# Patient Record
Sex: Female | Born: 1980 | Race: White | Hispanic: No | Marital: Married | Smoking: Never smoker
Health system: Southern US, Community
[De-identification: ages and names within clinical notes are randomized; demographics above are authoritative.]

## PROBLEM LIST (undated history)

## (undated) DIAGNOSIS — F419 Anxiety disorder, unspecified: Secondary | ICD-10-CM

## (undated) DIAGNOSIS — F32A Depression, unspecified: Secondary | ICD-10-CM

## (undated) DIAGNOSIS — E282 Polycystic ovarian syndrome: Secondary | ICD-10-CM

## (undated) DIAGNOSIS — F329 Major depressive disorder, single episode, unspecified: Secondary | ICD-10-CM

## (undated) HISTORY — DX: Depression, unspecified: F32.A

## (undated) HISTORY — DX: Anxiety disorder, unspecified: F41.9

## (undated) HISTORY — DX: Major depressive disorder, single episode, unspecified: F32.9

## (undated) HISTORY — DX: Polycystic ovarian syndrome: E28.2

---

## 2009-11-08 ENCOUNTER — Ambulatory Visit: Payer: Self-pay | Admitting: Obstetrics

## 2010-02-12 ENCOUNTER — Ambulatory Visit: Payer: Self-pay | Admitting: Obstetrics

## 2010-03-09 ENCOUNTER — Ambulatory Visit: Payer: Self-pay | Admitting: Obstetrics

## 2010-03-19 ENCOUNTER — Encounter: Payer: Self-pay | Admitting: Maternal & Fetal Medicine

## 2010-03-30 ENCOUNTER — Inpatient Hospital Stay (HOSPITAL_COMMUNITY)
Admission: AD | Admit: 2010-03-30 | Discharge: 2010-04-01 | Payer: Self-pay | Source: Home / Self Care | Attending: Obstetrics | Admitting: Obstetrics

## 2010-03-30 ENCOUNTER — Ambulatory Visit: Payer: Self-pay | Admitting: Obstetrics

## 2010-03-30 ENCOUNTER — Encounter: Payer: Self-pay | Admitting: Obstetrics & Gynecology

## 2010-05-02 NOTE — Discharge Summary (Addendum)
  Katie Benjamin, TRAXLER             ACCOUNT NO.:  [id number]  MEDICAL RECORD NO.:  [id number]          PATIENT TYPE:  INP  LOCATION:  9110                          FACILITY:  [facility]  PHYSICIAN:  Roseanna Rainbow, M.D.DATE OF BIRTH:  03-19-1981  DATE OF ADMISSION:  03/30/2010 DATE OF DISCHARGE:  04/01/2010                              DISCHARGE SUMMARY   CHIEF COMPLAINT:  The patient is a 30 year old, para 0 with an estimated date of confinement of April 08, 2010, with an intrauterine pregnancy at 38 plus weeks with oligohydramnios and a breech presentation.  HISTORY OF PRESENT ILLNESS:  Please see the above.  The patient was followed during the latter part of the pregnancy for oligohydramnios with serial ultrasounds and biweekly fetal testing.  An ultrasound on the day of admission showed an amniotic fluid index of 5 in a breech presentation.  The patient denies any symptoms consistent with spontaneous rupture of membranes.  ALLERGIES:  No known drug allergies.  MEDICATIONS:  Please see the medication reconciliation form.  PRENATAL LABORATORY DATA:  Chlamydia probe negative.  Urine culture and sensitivity no growth.  Pap smear negative, GC probe negative, GBS negative, 2-hour GTT within normal limits.  Hepatitis B surface antigen negative, platelets 250,000, blood type A positive, antibody screen negative, quad screen negative.  PAST GYNECOLOGIC HISTORY:  She denies.  PAST MEDICAL HISTORY:  She denies.  PAST SURGICAL HISTORY:  Tonsils and adenoids.  SOCIAL HISTORY:  She is a Airline pilot person.  She is married.  She denies any tobacco, ethanol or drug use.  Stressors include financial difficulty.  FAMILY HISTORY:  Hypertension, adult-onset diabetes.  REVIEW OF SYSTEMS:  Noncontributory.  PHYSICAL EXAMINATION:  VITAL SIGNS:  Stable, afebrile.  Blood pressure is 140/90. GENERAL:  Fetal heart tracing in the 150s with good long-term variability, no decelerations. ABDOMEN:   Gravid.  ASSESSMENT AND PLAN:  [place]ip[place] at term, oligohydramnios, amniotic fluid index 5, breech presentation, elevated blood pressures.  PLAN:  Admission, check a PIH panel, urinalysis. A primary cesarean delivery is planned.  Informed consent had been obtained.  HOSPITAL COURSE:  The patient was admitted.  She underwent a cesarean delivery.  Please see the dictated operative summary for findings.  On postoperative day 1, a hemoglobin was 10.1.  A preoperative hemoglobin was 12.8.  The patient was hemodynamically stable.  The patient's blood pressures remained stable and she was discharged to home on postoperative day 2.  DISCHARGE DIAGNOSES:  Intrauterine pregnancy at term, oligohydramnios, breech presentation.  PROCEDURES:  Cesarean delivery.  CONDITION:  Stable.  DIET:  Regular.  ACTIVITY:  Pelvic rest progressive activity.  MEDICATIONS:  Prenatal vitamins, Percocet 5/325, one to two tabs every 6 h. as needed.  DISPOSITION:  The patient was to follow up in the office in several days for staple removal.     Roseanna Rainbow, M.D.     LAJ/MEDQ  D:  04/28/2010  T:  04/29/2010  Job:  [id number]  Electronically Signed by Antionette Char M.D. on 05/02/2010 09:01:49 PM

## 2010-06-18 LAB — CBC
MCH: 31.4 pg (ref 26.0–34.0)
MCHC: 33.1 g/dL (ref 30.0–36.0)
MCHC: 34.5 g/dL (ref 30.0–36.0)
MCV: 90.9 fL (ref 78.0–100.0)
Platelets: 264 10*3/uL (ref 150–400)
RBC: 4.08 MIL/uL (ref 3.87–5.11)
RDW: 13.7 % (ref 11.5–15.5)
WBC: 13.7 10*3/uL — ABNORMAL HIGH (ref 4.0–10.5)

## 2010-06-18 LAB — COMPREHENSIVE METABOLIC PANEL
Alkaline Phosphatase: 138 U/L — ABNORMAL HIGH (ref 39–117)
BUN: 8 mg/dL (ref 6–23)
Creatinine, Ser: 0.52 mg/dL (ref 0.4–1.2)
Glucose, Bld: 78 mg/dL (ref 70–99)
Potassium: 4.1 mEq/L (ref 3.5–5.1)
Total Bilirubin: 0.6 mg/dL (ref 0.3–1.2)
Total Protein: 6.3 g/dL (ref 6.0–8.3)

## 2010-06-18 LAB — URIC ACID: Uric Acid, Serum: 3.6 mg/dL (ref 2.4–7.0)

## 2010-06-18 LAB — LACTATE DEHYDROGENASE: LDH: 213 U/L (ref 94–250)

## 2010-06-18 LAB — RPR: RPR Ser Ql: NONREACTIVE

## 2011-04-24 IMAGING — US US OB FOLLOW-UP
1 series · 17 of 28 positions shown · non-contrast
Comparison: none

REASON FOR EXAM: decreased amniotic fluid
COMMENTS:

[Series 1: us ob follow-up · 17 of 36 slices shown]
[im 1/36]
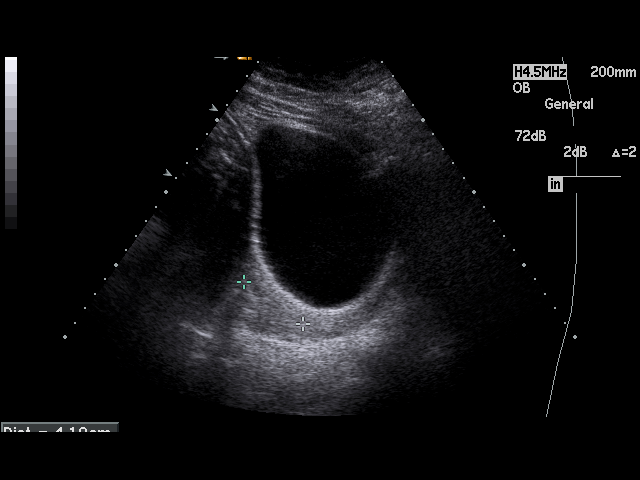
[im 3/36]
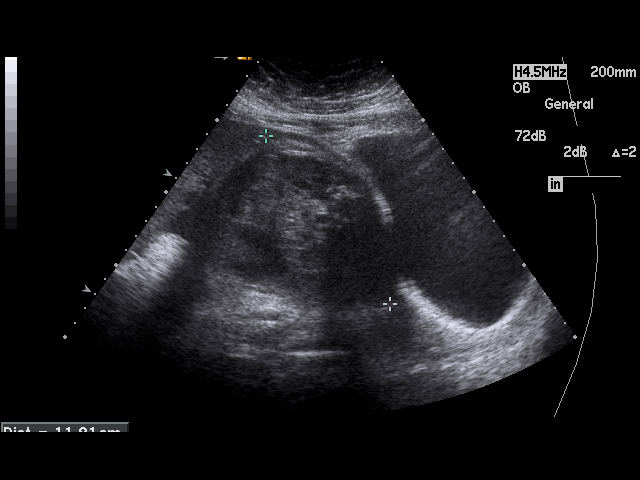
[im 6/36]
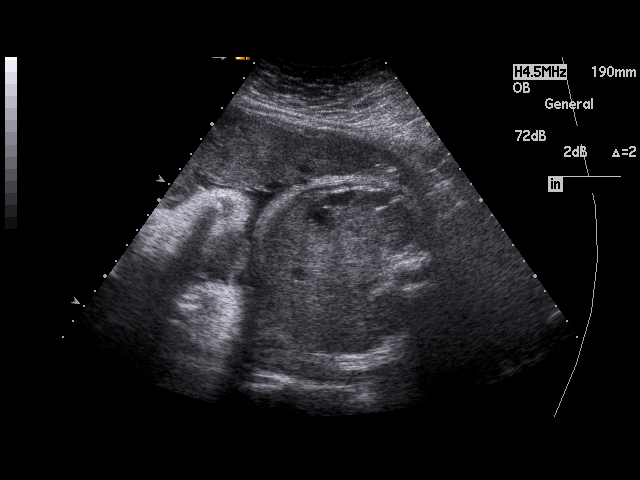
[im 7/36]
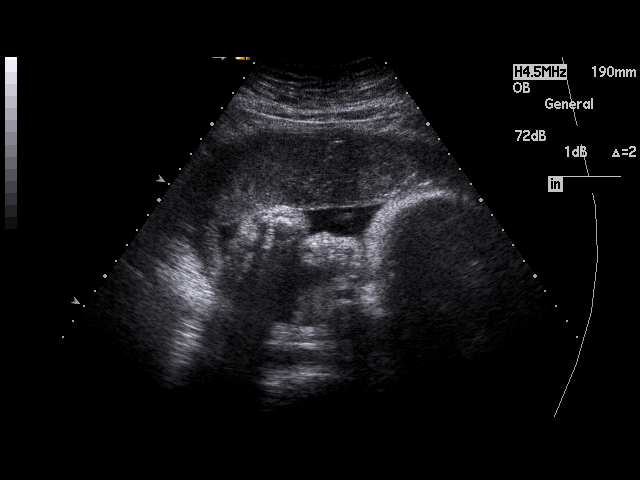
[im 10/36]
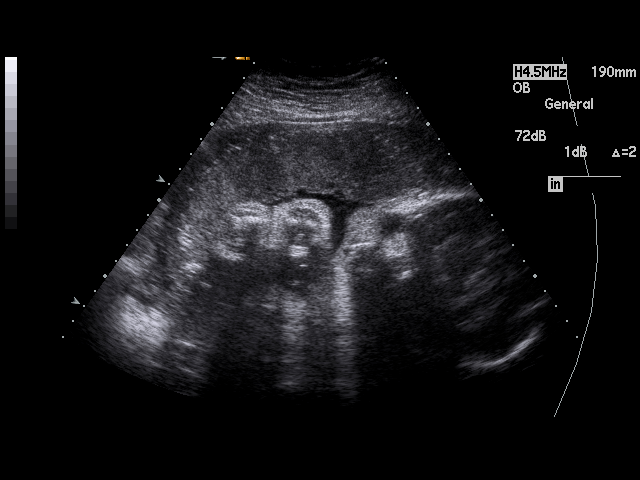
[im 12/36]
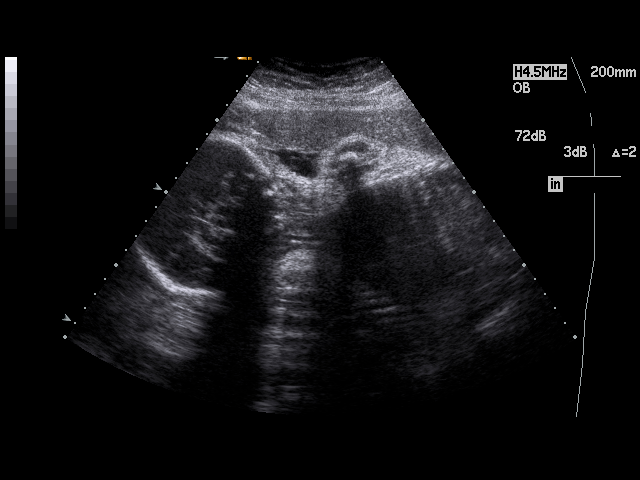
[im 13/36]
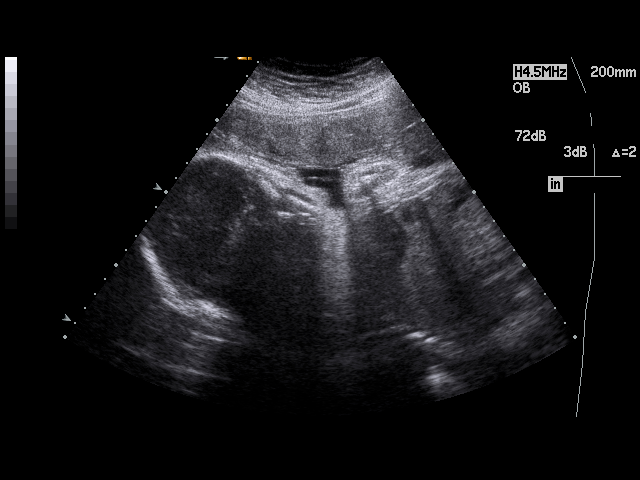
[im 16/36]
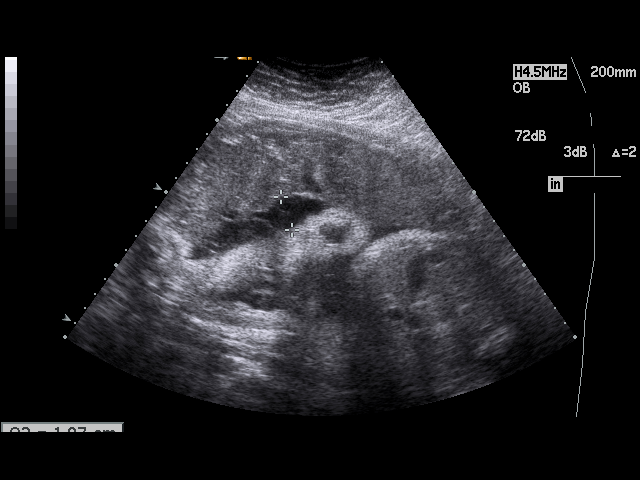
[im 19/36]
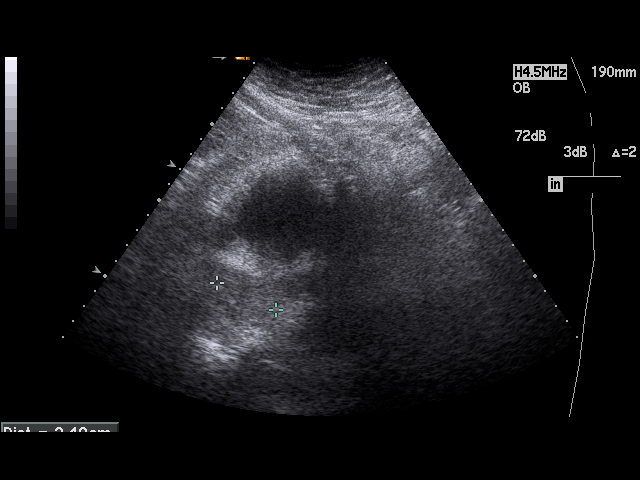
[im 20/36]
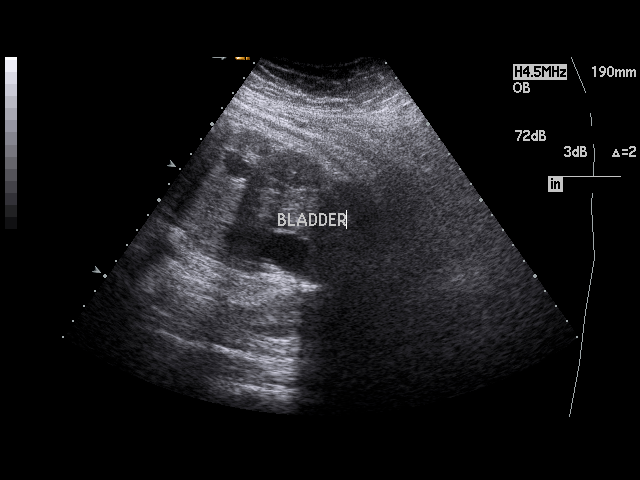
[im 23/36]
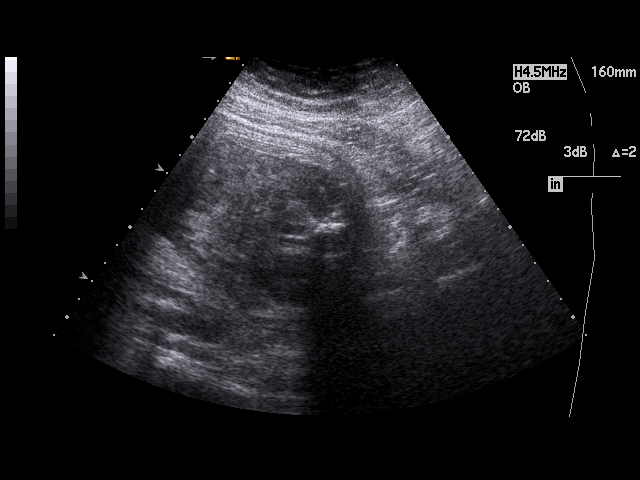
[im 24/36]
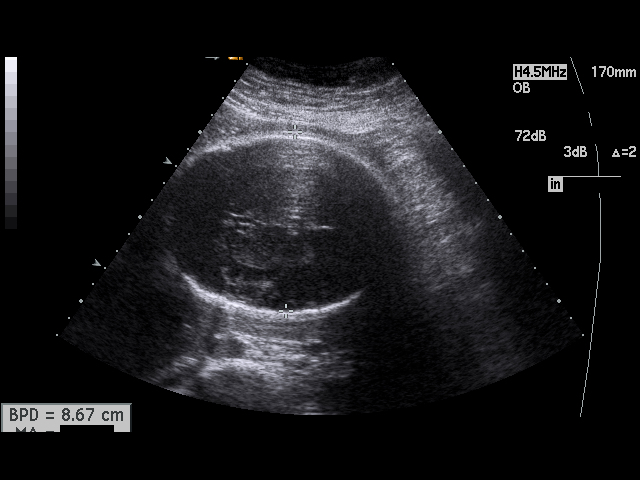
[im 26/36]
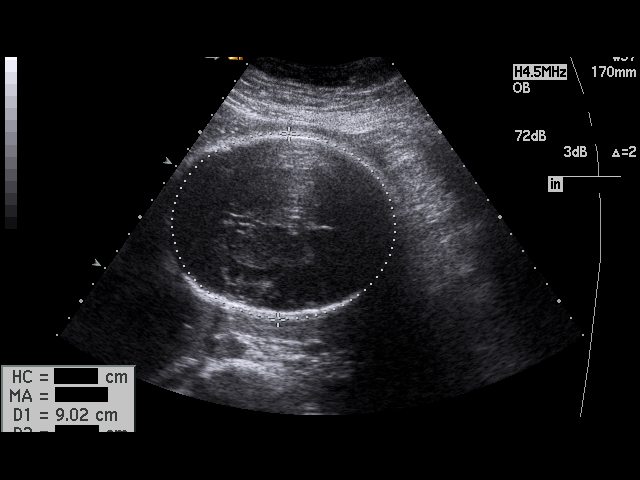
[im 29/36]
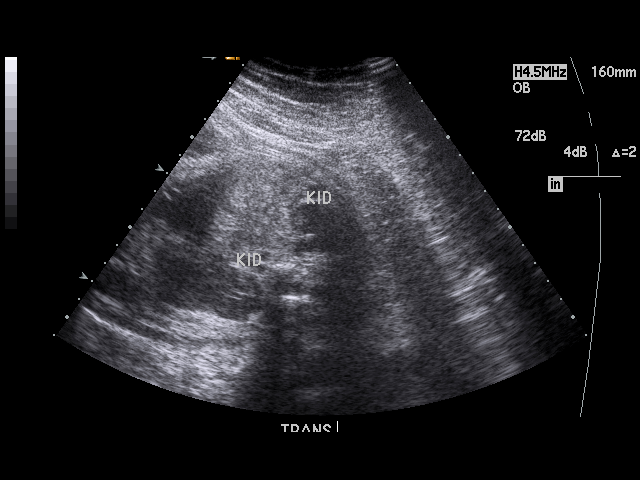
[im 30/36]
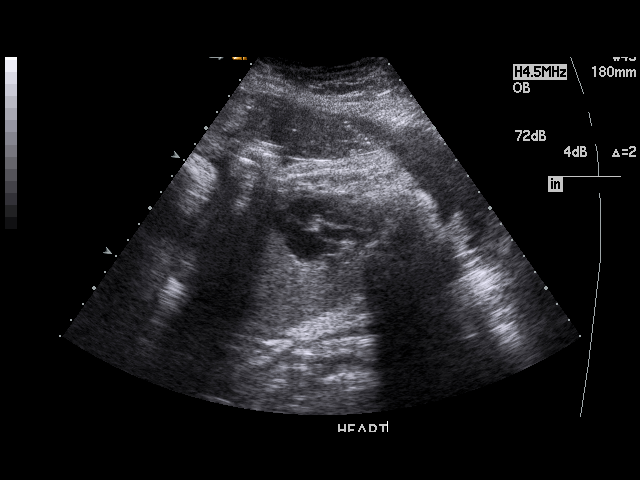
[im 33/36]
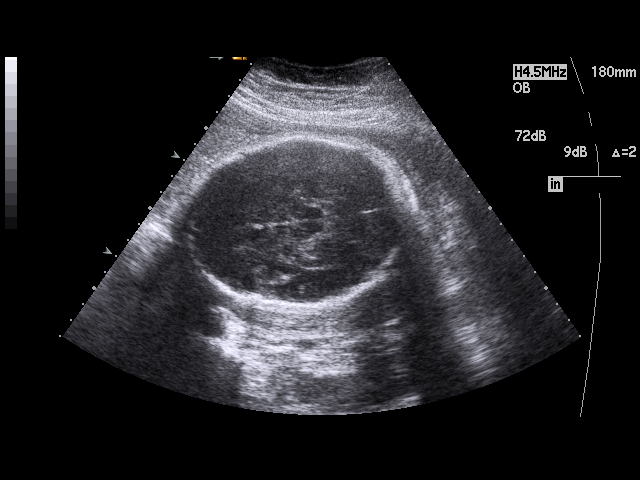
[im 36/36]
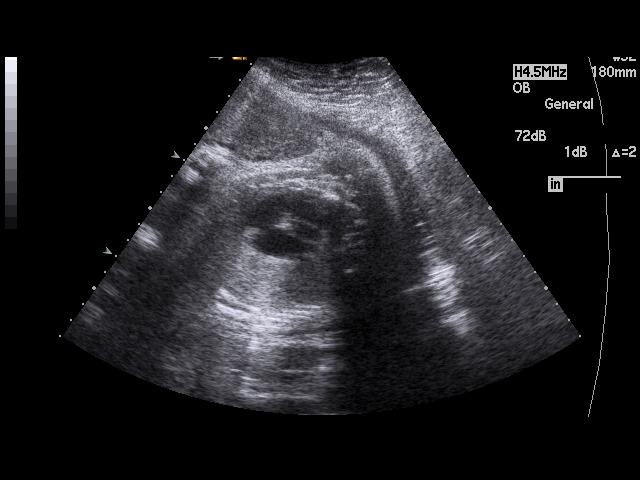

[17 of 28 positions shown; findings below may reference images not displayed]

PROCEDURE:     US  - US OB FOLLOW UP  - March 09, 2010 [DATE]

RESULT:     The patient underwent previous fetal ultrasounds in [REDACTED] and
early February 2010. On today's study the amniotic fluid index is decreased
at approximately 10.8%. This is not greatly changed from the previous study.
A fetal cardiac rate of 124 beats per minute was demonstrated. Survey views
of the the stomach and kidneys and urinary bladder were normal. The placenta
exhibits no evidence of placenta previa.

Measured parameters:
BPD 8.67 cm corresponding to an EGA of 35 weeks 0 days
HC 31.24 cm corresponding to an EGA of 35 weeks 0 days
A.c. 28.8 cm corresponding to an EGA of 32 weeks 6 days
And FL 6. 97 cm corresponding to an EGA of 35 weeks 5 days. The estimated
fetal weight is 6770 grams plus or minus 350 grams.
IMPRESSION: There is a viable IUP with estimated gestational age of 34
weeks 4 days plus or minus approximately 21 days. The estimated date of
confinement is 16 April, 2010. The amniotic fluid volume is between the 5th
and 50th percentile.

## 2011-06-30 ENCOUNTER — Encounter (HOSPITAL_COMMUNITY): Payer: Self-pay

## 2011-06-30 ENCOUNTER — Emergency Department (HOSPITAL_COMMUNITY)

## 2011-06-30 ENCOUNTER — Encounter (HOSPITAL_COMMUNITY): Payer: Self-pay | Admitting: *Deleted

## 2011-06-30 ENCOUNTER — Emergency Department (HOSPITAL_COMMUNITY)
Admission: EM | Admit: 2011-06-30 | Discharge: 2011-06-30 | Disposition: A | Discharge status: Home / Self Care | Attending: Emergency Medicine | Admitting: Emergency Medicine

## 2011-06-30 ENCOUNTER — Emergency Department (INDEPENDENT_AMBULATORY_CARE_PROVIDER_SITE_OTHER)
Admission: EM | Admit: 2011-06-30 | Discharge: 2011-06-30 | Disposition: A | Discharge status: Nursing Home (Includes ICF) | Source: Home / Self Care | Attending: Emergency Medicine | Admitting: Emergency Medicine

## 2011-06-30 DIAGNOSIS — K5732 Diverticulitis of large intestine without perforation or abscess without bleeding: Secondary | ICD-10-CM | POA: Insufficient documentation

## 2011-06-30 DIAGNOSIS — R109 Unspecified abdominal pain: Secondary | ICD-10-CM

## 2011-06-30 DIAGNOSIS — K5792 Diverticulitis of intestine, part unspecified, without perforation or abscess without bleeding: Secondary | ICD-10-CM

## 2011-06-30 DIAGNOSIS — R1032 Left lower quadrant pain: Secondary | ICD-10-CM | POA: Insufficient documentation

## 2011-06-30 LAB — DIFFERENTIAL
Basophils Absolute: 0 10*3/uL (ref 0.0–0.1)
Basophils Relative: 0 % (ref 0–1)
Eosinophils Absolute: 0.1 10*3/uL (ref 0.0–0.7)
Eosinophils Relative: 1 % (ref 0–5)
Lymphocytes Relative: 25 % (ref 12–46)
Lymphs Abs: 2.6 10*3/uL (ref 0.7–4.0)
Monocytes Absolute: 0.6 10*3/uL (ref 0.1–1.0)
Monocytes Relative: 6 % (ref 3–12)
Neutro Abs: 6.8 10*3/uL (ref 1.7–7.7)
Neutrophils Relative %: 67 % (ref 43–77)

## 2011-06-30 LAB — COMPREHENSIVE METABOLIC PANEL
ALT: 31 U/L (ref 0–35)
AST: 20 U/L (ref 0–37)
Albumin: 3.7 g/dL (ref 3.5–5.2)
Alkaline Phosphatase: 91 U/L (ref 39–117)
BUN: 8 mg/dL (ref 6–23)
CO2: 25 mEq/L (ref 19–32)
Calcium: 9.3 mg/dL (ref 8.4–10.5)
Chloride: 103 mEq/L (ref 96–112)
Creatinine, Ser: 0.72 mg/dL (ref 0.50–1.10)
GFR calc Af Amer: >90 mL/min (ref >90)
GFR calc non Af Amer: >90 mL/min (ref >90)
Glucose, Bld: 85 mg/dL (ref 70–99)
Potassium: 3.9 mEq/L (ref 3.5–5.1)
Sodium: 138 mEq/L (ref 135–145)
Total Bilirubin: 0.3 mg/dL (ref 0.3–1.2)
Total Protein: 6.9 g/dL (ref 6.0–8.3)

## 2011-06-30 LAB — WET PREP, GENITAL
Clue Cells Wet Prep HPF POC: NONE SEEN
Trich, Wet Prep: NONE SEEN
Yeast Wet Prep HPF POC: NONE SEEN

## 2011-06-30 LAB — POCT URINALYSIS DIP (DEVICE)
Bilirubin Urine: NEGATIVE
Glucose, UA: NEGATIVE mg/dL
Ketones, ur: NEGATIVE mg/dL
Leukocytes, UA: NEGATIVE
Nitrite: NEGATIVE

## 2011-06-30 LAB — URINALYSIS, ROUTINE W REFLEX MICROSCOPIC
Glucose, UA: NEGATIVE mg/dL
Leukocytes, UA: NEGATIVE
Protein, ur: NEGATIVE mg/dL
Urobilinogen, UA: 0.2 mg/dL (ref 0.0–1.0)

## 2011-06-30 LAB — CBC
HCT: 38.9 % (ref 36.0–46.0)
Hemoglobin: 13.5 g/dL (ref 12.0–15.0)
MCH: 31.4 pg (ref 26.0–34.0)
MCHC: 34.7 g/dL (ref 30.0–36.0)
MCV: 90.5 fL (ref 78.0–100.0)
Platelets: 326 10*3/uL (ref 150–400)
RBC: 4.3 MIL/uL (ref 3.87–5.11)
RDW: 13.4 % (ref 11.5–15.5)
WBC: 10.2 10*3/uL (ref 4.0–10.5)

## 2011-06-30 LAB — POCT PREGNANCY, URINE: Preg Test, Ur: NEGATIVE

## 2011-06-30 MED ORDER — CIPROFLOXACIN HCL 500 MG PO TABS
500.0000 mg | ORAL_TABLET | Freq: Once | ORAL | Status: AC
  Administered 2011-06-30: 500 mg via ORAL
  Filled 2011-06-30: qty 1

## 2011-06-30 MED ORDER — METRONIDAZOLE 500 MG PO TABS: 500.0000 mg | ORAL_TABLET | Freq: Two times a day (BID) | ORAL | Status: AC

## 2011-06-30 MED ORDER — HYDROCODONE-ACETAMINOPHEN 5-500 MG PO TABS: 1.0000 | ORAL_TABLET | Freq: Four times a day (QID) | ORAL | Status: AC | PRN

## 2011-06-30 MED ORDER — IOHEXOL 300 MG/ML  SOLN
100.0000 mL | Freq: Once | INTRAMUSCULAR | Status: AC | PRN
  Administered 2011-06-30: 100 mL via INTRAVENOUS

## 2011-06-30 MED ORDER — CIPROFLOXACIN HCL 500 MG PO TABS: 500.0000 mg | ORAL_TABLET | Freq: Two times a day (BID) | ORAL | Status: AC

## 2011-06-30 MED ORDER — METRONIDAZOLE 500 MG PO TABS
500.0000 mg | ORAL_TABLET | Freq: Once | ORAL | Status: AC
  Administered 2011-06-30: 500 mg via ORAL
  Filled 2011-06-30: qty 1

## 2011-06-30 MED ORDER — IOHEXOL 300 MG/ML  SOLN
20.0000 mL | INTRAMUSCULAR | Status: AC
  Administered 2011-06-30: 20 mL via ORAL

## 2011-06-30 MED ORDER — ONDANSETRON HCL 4 MG PO TABS: 4.0000 mg | ORAL_TABLET | Freq: Four times a day (QID) | ORAL | Status: AC

## 2011-06-30 NOTE — ED Notes (Signed)
Pt sent here from [facility] for further eval of LLQ pain x 12 hours. Pt afebrile, had normal urine at [facility].

## 2011-06-30 NOTE — ED Provider Notes (Signed)
History     CSN: [id number]  Arrival date & time 06/30/11  1207   First MD Initiated Contact with Patient 06/30/11 1309      Chief Complaint  Patient presents with  . Abdominal Pain    (Consider location/radiation/quality/duration/timing/severity/associated sxs/prior treatment) HPI [name] is a 31 yo female with a history of abnormal menstrual periods, who presents today for LLQ tenderness.  Pt states she woke up with pain around midnight but was able to go back to sleep.  When she awoke this morning, there was extreme pain with standing and she was not able to fully extend her back due to the pain in her LLQ.  Once she sat back down the pain subsided, but it is re-aggravated with movement.  Pt did not get relief with the ibuprofen.  She does not want any pain medication now.  She has not been sick recently and denies and fever, chills, N/V/D, weight changes, vaginal discharge or bleeding, rectal bleeding, and has never had anything like this before.    History reviewed. No pertinent past medical history.  Past Surgical History  Procedure Date  . Cesarean section     History reviewed. No pertinent family history.  History  Substance Use Topics  . Smoking status: Never Smoker   . Smokeless tobacco: Not on file  . Alcohol Use: No    OB History    Grav Para Term Preterm Abortions TAB SAB Ect Mult Living                  Review of Systems All pertinent positives and negatives reviewed in the history of present illness  Allergies  Review of patient's allergies indicates no known allergies.  Home Medications   Current Outpatient Rx  Name Route Sig Dispense Refill  . PRENATAL MULTIVITAMIN CH Oral Take 1 tablet by mouth daily.      BP 136/87  Pulse 77  Temp(Src) 97.5 F (36.4 C) (Oral)  Resp 16  Ht 5\' 7"  (1.702 m)  Wt 270 lb (122.471 kg)  BMI 42.29 kg/m2  SpO2 95%  LMP 02/19/2011  Physical Exam  Constitutional: She is oriented to person, place, and time.  She appears well-developed and well-nourished. No distress.  Cardiovascular: Normal rate, regular rhythm, normal heart sounds and intact distal pulses.   No murmur heard. Pulmonary/Chest: Effort normal and breath sounds normal. No respiratory distress. She has no wheezes.  Abdominal: Soft. Bowel sounds are normal. She exhibits no distension. There is tenderness (LLQ tenderness).  Genitourinary: There is no rash, tenderness or lesion on the right labia. There is no rash, tenderness or lesion on the left labia. Cervix exhibits discharge (white smooth discharge from cervical os and in vaginal canal). Cervix exhibits no motion tenderness and no friability. Left adnexum displays tenderness. Left adnexum displays no mass and no fullness. No erythema or bleeding around the vagina. Vaginal discharge found.  Neurological: She is alert and oriented to person, place, and time.  Skin: Skin is warm. No rash noted. She is not diaphoretic. No erythema.    ED Course  Procedures (including critical care time)  Labs Reviewed - No data to display No results found.  Pt seen and assessed.  Does not want any pain meds.  Will order pelvic u/s. 2:36 PM Pt seen.  Pain is still same as before.  Comfortable, resting in bed. 4:05 PM Pelvic done.  Will await results from CT; no cervical motion tenderness; pain in left adnexal region with manual  exam- no acute abnormalities. Dr. Fredderick Phenix given full patient report and will follow up on her. MDM  MDM Reviewed: nursing note and vitals Interpretation: labs, ultrasound and CT scan            Carlyle Dolly, PA-C 06/30/11 1614

## 2011-06-30 NOTE — ED Notes (Signed)
Pt aware of the need for urine sample  

## 2011-06-30 NOTE — ED Notes (Signed)
PA-C at bedside 

## 2011-06-30 NOTE — ED Notes (Signed)
Patient is resting comfortably.no complaints  

## 2011-06-30 NOTE — ED Provider Notes (Signed)
Results for orders placed during the hospital encounter of 06/30/11  CBC      Component Value Range   WBC 10.2  4.0 - 10.5 (K/uL)   RBC 4.30  3.87 - 5.11 (MIL/uL)   Hemoglobin 13.5  12.0 - 15.0 (g/dL)   HCT 40.9  81.1 - 91.4 (%)   MCV 90.5  78.0 - 100.0 (fL)   MCH 31.4  26.0 - 34.0 (pg)   MCHC 34.7  30.0 - 36.0 (g/dL)   RDW 78.2  95.6 - 21.3 (%)   Platelets 326  150 - 400 (K/uL)  DIFFERENTIAL      Component Value Range   Neutrophils Relative 67  43 - 77 (%)   Neutro Abs 6.8  1.7 - 7.7 (K/uL)   Lymphocytes Relative 25  12 - 46 (%)   Lymphs Abs 2.6  0.7 - 4.0 (K/uL)   Monocytes Relative 6  3 - 12 (%)   Monocytes Absolute 0.6  0.1 - 1.0 (K/uL)   Eosinophils Relative 1  0 - 5 (%)   Eosinophils Absolute 0.1  0.0 - 0.7 (K/uL)   Basophils Relative 0  0 - 1 (%)   Basophils Absolute 0.0  0.0 - 0.1 (K/uL)  COMPREHENSIVE METABOLIC PANEL      Component Value Range   Sodium 138  135 - 145 (mEq/L)   Potassium 3.9  3.5 - 5.1 (mEq/L)   Chloride 103  96 - 112 (mEq/L)   CO2 25  19 - 32 (mEq/L)   Glucose, Bld 85  70 - 99 (mg/dL)   BUN 8  6 - 23 (mg/dL)   Creatinine, Ser 0.86  0.50 - 1.10 (mg/dL)   Calcium 9.3  8.4 - 57.8 (mg/dL)   Total Protein 6.9  6.0 - 8.3 (g/dL)   Albumin 3.7  3.5 - 5.2 (g/dL)   AST 20  0 - 37 (U/L)   ALT 31  0 - 35 (U/L)   Alkaline Phosphatase 91  39 - 117 (U/L)   Total Bilirubin 0.3  0.3 - 1.2 (mg/dL)   GFR calc non Af Amer >90  >90 (mL/min)   GFR calc Af Amer >90  >90 (mL/min)  WET PREP, GENITAL      Component Value Range   Yeast Wet Prep HPF POC NONE SEEN  NONE SEEN    Trich, Wet Prep NONE SEEN  NONE SEEN    Clue Cells Wet Prep HPF POC NONE SEEN  NONE SEEN    WBC, Wet Prep HPF POC MODERATE (*) NONE SEEN   URINALYSIS, ROUTINE W REFLEX MICROSCOPIC      Component Value Range   Color, Urine YELLOW  YELLOW    APPearance CLEAR  CLEAR    Specific Gravity, Urine 1.007  1.005 - 1.030    pH 6.5  5.0 - 8.0    Glucose, UA NEGATIVE  NEGATIVE (mg/dL)   Hgb urine  dipstick NEGATIVE  NEGATIVE    Bilirubin Urine NEGATIVE  NEGATIVE    Ketones, ur NEGATIVE  NEGATIVE (mg/dL)   Protein, ur NEGATIVE  NEGATIVE (mg/dL)   Urobilinogen, UA 0.2  0.0 - 1.0 (mg/dL)   Nitrite NEGATIVE  NEGATIVE    Leukocytes, UA NEGATIVE  NEGATIVE    US Transvaginal Non-ob  06/30/2011  *RADIOLOGY REPORT*  Clinical Data: Right-sided pelvic pain, irregular menses  TRANSABDOMINAL AND TRANSVAGINAL ULTRASOUND OF PELVIS Technique:  Both transabdominal and transvaginal ultrasound examinations of the pelvis were performed. Transabdominal technique was performed for global imaging of the  pelvis including uterus, ovaries, adnexal regions, and pelvic cul-de-sac.  Comparison: None.   It was necessary to proceed with endovaginal exam following the transabdominal exam to visualize the bilateral ovaries, uterus and endometrium.  Findings:  Examination is limited due to patient body habitus.  Uterus: Normal in size, measuring 7.0 x 4.1 x 2.1 cm.  The uterus is noted to be retroverted and retroflexed.  Endometrium: Normal in size measuring 9 mm in diameter.  Right ovary:  Normal in size, measuring 2.8 x 2.4 x 3.2 cm. Several follicles are noted about the periphery of the right ovary. There is a minimal amount of free fluid adjacent to the right ovary, possibly physiologic.  Left ovary: The left ovary is normal in size, measuring 2.8 x 1.7 x 1.9 cm.  Several follicles are noted within the periphery of the left ovary.  Other findings: There is a minimal amount of free fluid within the pelvic cul-de-sac.  IMPRESSION:  1.  No sonographic explanation for patient's right-sided pelvic pain.  2.  Minimal amount of fluid adjacent to the otherwise normal appearing right ovary, likely physiologic. 3.  Minimal amount of fluid within the pelvic cul-de-sac, likely physiologic.  Original Report Authenticated By: Waynard Reeds, M.D.   US Pelvis Complete  06/30/2011  *RADIOLOGY REPORT*  Clinical Data: Right-sided pelvic pain,  irregular menses  TRANSABDOMINAL AND TRANSVAGINAL ULTRASOUND OF PELVIS Technique:  Both transabdominal and transvaginal ultrasound examinations of the pelvis were performed. Transabdominal technique was performed for global imaging of the pelvis including uterus, ovaries, adnexal regions, and pelvic cul-de-sac.  Comparison: None.   It was necessary to proceed with endovaginal exam following the transabdominal exam to visualize the bilateral ovaries, uterus and endometrium.  Findings:  Examination is limited due to patient body habitus.  Uterus: Normal in size, measuring 7.0 x 4.1 x 2.1 cm.  The uterus is noted to be retroverted and retroflexed.  Endometrium: Normal in size measuring 9 mm in diameter.  Right ovary:  Normal in size, measuring 2.8 x 2.4 x 3.2 cm. Several follicles are noted about the periphery of the right ovary. There is a minimal amount of free fluid adjacent to the right ovary, possibly physiologic.  Left ovary: The left ovary is normal in size, measuring 2.8 x 1.7 x 1.9 cm.  Several follicles are noted within the periphery of the left ovary.  Other findings: There is a minimal amount of free fluid within the pelvic cul-de-sac.  IMPRESSION:  1.  No sonographic explanation for patient's right-sided pelvic pain.  2.  Minimal amount of fluid adjacent to the otherwise normal appearing right ovary, likely physiologic. 3.  Minimal amount of fluid within the pelvic cul-de-sac, likely physiologic.  Original Report Authenticated By: Waynard Reeds, M.D.   Ct Abdomen Pelvis W Contrast  06/30/2011  *RADIOLOGY REPORT*  Clinical Data: 31 year old female with abdominal and pelvic pain.  CT ABDOMEN AND PELVIS WITH CONTRAST  Technique:  Multidetector CT imaging of the abdomen and pelvis was performed following the standard protocol during bolus administration of intravenous contrast.  Contrast:  100 ml intravenous Omnipaque-300  Comparison: 06/30/2011 pelvic ultrasound.  Findings: The liver, spleen,  gallbladder, adrenal glands, pancreas and kidneys are unremarkable.  No free fluid, enlarged lymph nodes, biliary dilation or abdominal aortic aneurysm identified.  A small amount of inflammation along the distal descending colon (images 57 - 60) is noted and most likely represents either mild diverticulitis or epiploic appendagitis. There is no evidence of focal abscess  or pneumoperitoneum.  No other bowel abnormalities are identified. The uterus and adnexal regions are within normal limits. The bladder is unremarkable.  No acute or suspicious bony abnormalities are identified.  IMPRESSION: Mild inflammation along the distal descending colon likely representing mild diverticulitis or epiploic appendagitis.  No evidence of pneumoperitoneum or abscess.  Original Report Authenticated By: Rosendo Gros, M.D.        Care taken over from [place].  Pt with LLQ pain.  Labs okay.  Feeling much better.  abd only minimally tender on exam.  No vomiting.  CT scan shows possible small area of diverticulitis.  Will tx with abx, f/u with her PMD in [facility]  Rolan Bucco, MD 06/30/11 1843

## 2011-06-30 NOTE — ED Notes (Signed)
PT ambulated with a steady gait; VSS; A&Ox3; no signs of distress; respirations even and unlabored; skin warm and dry. NO questions at this time.

## 2011-06-30 NOTE — Discharge Instructions (Signed)
Diverticulitis Small pockets or "bubbles" can develop in the wall of the intestine. Diverticulitis is when those pockets become infected and inflamed. This causes stomach pain (usually on the left side). HOME CARE  Take all medicine as told by your doctor.   Try a clear liquid diet (broth, tea, or water) for as long as told by your doctor.   Keep all follow-up visits with your doctor.   You may be put on a low-fiber diet once you start feeling better. Here are foods that have low-fiber:   White breads, cereals, rice, and pasta.   Cooked fruits and vegetables or soft fresh fruits and vegetables without the skin.   Ground or well-cooked tender beef, ham, veal, lamb, pork, or poultry.   Eggs and seafood.   After you are doing well on the low-fiber diet, you may be put on a high-fiber diet. Here are ways to increase your fiber:   Choose whole-grain breads, cereals, pasta, and brown rice.   Choose fruits and vegetables with skin on. Do not overcook the vegetables.   Choose nuts, seeds, legumes, dried peas, beans, and lentils.   Look for food products that have more than 3 grams of fiber per serving on the food label.  GET HELP RIGHT AWAY IF:  Your pain does not get better or gets worse.   You have trouble eating food.   You are not pooping (having bowel movements) like normal.   You have a temperature by mouth above 102 F (38.9 C), not controlled by medicine.   You keep throwing up (vomiting).   You have bloody or black, tarry poop (stools).   You are getting worse and not better.  MAKE SURE YOU:   Understand these instructions.   Will watch your condition.   Will get help right away if you are not doing well or get worse.  Document Released: 09/11/2007 Document Revised: 03/14/2011 Document Reviewed: 02/13/2009 [employer] Patient Information 2012 [employer]. 

## 2011-06-30 NOTE — ED Notes (Addendum)
Pt. C/o lower abdominal pain since 06/29/11 1300.  States pain with urination; states urine looks cloudy.  Denies urgency, increased frequency, fevers. C/o pain in abdomen with deep breaths. Reports taking motrin yesterday PM and went to bed; states woke up this AM still in pain and reports difficulty standing upright due to abdomen pain. Pt. Reported last menstrual period 02/19/11.  States she is trying to get pregnant.  Went to PCP in December 2012 and February 2013 for pregnancy tests and both were negative.

## 2011-06-30 NOTE — ED Provider Notes (Signed)
History     CSN: [id number]  Arrival date & time 06/30/11  1058   First MD Initiated Contact with Patient 06/30/11 1105      Chief Complaint  Patient presents with  . Abdominal Pain    (Consider location/radiation/quality/duration/timing/severity/associated sxs/prior treatment) HPI Comments: Patient presents urgent care complaining of sudden onset of left lower abdominal pain this started around midnight. Patient describes that the pain has become steady and constant located to her left lower quadrant pain initially and she was in the toilet and urinated last night noted some discomfort when she urinated. That sensation has seen this faded away and expresses no urinary symptoms including when she gave [place] a urine sample this morning.. Pains seem to be more severe this morning and is even increasing and is exacerbated with mild movements to the point that she is having problems walking.  Patient denies any further symptoms such as fever, diarrheas, vomiting. She describes she's been undergoing infertility consults but is currently on no particular hormonal regimen or treatment.  Patient is a 31 y.o. female presenting with abdominal pain. The history is provided by the patient.  Abdominal Pain The primary symptoms of the illness include abdominal pain. The primary symptoms of the illness do not include fever, nausea, vomiting, dysuria, vaginal discharge or vaginal bleeding. The current episode started 13 to 24 hours ago. The onset of the illness was sudden. The problem has been gradually worsening.  Symptoms associated with the illness do not include chills, anorexia, constipation, urgency, frequency or back pain.    No past medical history on file.  Past Surgical History  Procedure Date  . Cesarean section     No family history on file.  History  Substance Use Topics  . Smoking status: Never Smoker   . Smokeless tobacco: Not on file  . Alcohol Use: No    OB History    Grav  Para Term Preterm Abortions TAB SAB Ect Mult Living                  Review of Systems  Constitutional: Negative for fever and chills.  Gastrointestinal: Positive for abdominal pain. Negative for nausea, vomiting, constipation and anorexia.  Genitourinary: Negative for dysuria, urgency, frequency, flank pain, vaginal bleeding and vaginal discharge.  Musculoskeletal: Negative for back pain.    Allergies  Review of patient's allergies indicates no known allergies.  Home Medications  No current outpatient prescriptions on file.  BP 141/86  Pulse 88  Temp(Src) 97.6 F (36.4 C) (Oral)  SpO2 96%  LMP 02/19/2011  Physical Exam  Constitutional: She appears distressed.  HENT:  Head: Normocephalic.  Abdominal: She exhibits no distension and no mass. There is tenderness in the left upper quadrant. There is rebound and guarding. There is no rigidity, no tenderness at McBurney's point and negative Murphy's sign.      ED Course  Procedures (including critical care time)   Labs Reviewed  POCT URINALYSIS DIP (DEVICE)  POCT PREGNANCY, URINE   No results found.   No diagnosis found.    MDM  Sudden onset left lower quadrant pain approximately 12 hours ago. Afebrile normal urine. Concern for acute condition that would include ovarian source versus diverticulitis. Exam was suggestive of peritoneal reaction is now marked rebound was noted on exam.        Jimmie Molly, MD 06/30/11 1204

## 2011-06-30 NOTE — ED Notes (Signed)
Patient transported to CT 

## 2011-07-01 LAB — GC/CHLAMYDIA PROBE AMP, GENITAL
Chlamydia, DNA Probe: NEGATIVE
GC Probe Amp, Genital: NEGATIVE

## 2011-07-01 NOTE — ED Provider Notes (Signed)
Medical screening examination/treatment/procedure(s) were performed by non-physician practitioner and as supervising physician I was immediately available for consultation/collaboration.   Rolan Bucco, MD 07/01/11 [phone]

## 2012-01-08 ENCOUNTER — Other Ambulatory Visit: Payer: Self-pay

## 2013-06-27 ENCOUNTER — Emergency Department (INDEPENDENT_AMBULATORY_CARE_PROVIDER_SITE_OTHER)
Admission: EM | Admit: 2013-06-27 | Discharge: 2013-06-27 | Disposition: A | Discharge status: Home / Self Care | Source: Home / Self Care | Attending: Emergency Medicine | Admitting: Emergency Medicine

## 2013-06-27 ENCOUNTER — Encounter (HOSPITAL_COMMUNITY): Payer: Self-pay | Admitting: Emergency Medicine

## 2013-06-27 DIAGNOSIS — K5289 Other specified noninfective gastroenteritis and colitis: Secondary | ICD-10-CM

## 2013-06-27 DIAGNOSIS — K529 Noninfective gastroenteritis and colitis, unspecified: Secondary | ICD-10-CM

## 2013-06-27 LAB — POCT PREGNANCY, URINE: PREG TEST UR: NEGATIVE

## 2013-06-27 LAB — POCT URINALYSIS DIP (DEVICE)
BILIRUBIN URINE: NEGATIVE
GLUCOSE, UA: NEGATIVE mg/dL
HGB URINE DIPSTICK: NEGATIVE
Ketones, ur: NEGATIVE mg/dL
Leukocytes, UA: NEGATIVE
NITRITE: NEGATIVE
Protein, ur: NEGATIVE mg/dL
Specific Gravity, Urine: 1.025 (ref 1.005–1.030)
Urobilinogen, UA: 0.2 mg/dL (ref 0.0–1.0)
pH: 6 (ref 5.0–8.0)

## 2013-06-27 MED ORDER — ONDANSETRON 8 MG PO TBDP: 8.0000 mg | ORAL_TABLET | Freq: Three times a day (TID) | ORAL | Status: DC | PRN

## 2013-06-27 MED ORDER — ONDANSETRON 4 MG PO TBDP
8.0000 mg | ORAL_TABLET | Freq: Once | ORAL | Status: AC
  Administered 2013-06-27: 8 mg via ORAL

## 2013-06-27 MED ORDER — ONDANSETRON 4 MG PO TBDP
ORAL_TABLET | ORAL | Status: AC
  Filled 2013-06-27: qty 2

## 2013-06-27 MED ORDER — DIPHENOXYLATE-ATROPINE 2.5-0.025 MG PO TABS
1.0000 | ORAL_TABLET | Freq: Four times a day (QID) | ORAL | Status: DC | PRN
Start: 2013-06-27 — End: 2015-04-11

## 2013-06-27 NOTE — ED Notes (Addendum)
Patient states that she has had cramping and diarrhea since yesterday. States that she saw what looked like a worm to her after a bowel movement. She mentions that she and a friend [place] yesterday and does not know if that's related. They had fish and chicken, she felt fine before that and her friend is not sick. A manual order for a stool sample will be placed to check for parasites patient was advised on instructions and when to return with sample.

## 2013-06-27 NOTE — ED Provider Notes (Signed)
  Chief Complaint   Chief Complaint  Patient presents with  . Diarrhea     History of Present Illness   Katie Benjamin is a 33 year old female who's had a two-day history of nausea, diarrhea, crampy abdominal pain, and chills. She denies any fever, vomiting, or blood in the stool. She thinks she may have seen a worm in the stool. She has no history of foreign travel outside the [place]. She does not ingest raw foods. She's not sure how she could have been exposed to any type of parasitic illness. She denies any suspicious exposures, suspicious ingestions, or foreign travel.  Review of Systems   Other than as noted above, the patient denies any of the following symptoms: Systemic:  No fevers, chills, weight loss or dizziness. ENT:  No nasal congestion, rhinorrhea, or sore throat. Lungs:  No cough. GI:  Blood in stool or vomitus. GU:  No dysuria, frequency, or urgency.  PMFSH   Past medical history, family history, social history, meds, and allergies were reviewed.  She has a history of diverticulitis. She takes phentermine.  Physical Exam     Vital signs:  BP 154/86  Pulse 95  Temp(Src) 98.2 F (36.8 C) (Oral)  Resp 18  SpO2 100%  LMP 06/07/2013 General:  Alert and oriented.  In no distress.  Skin warm and dry.  Good skin turgor, brisk capillary refill. ENT:  No scleral icterus, moist mucous membranes, no oral lesions, pharynx clear. Lungs:  Breath sounds clear and equal bilaterally.  No wheezes, rales, or rhonchi. Heart:  Rhythm regular, without extrasystoles.  No gallops or murmers. Abdomen:  Soft, flat, nondistended. No organomegaly or mass. Bowel sounds were normally active. There was no tenderness, guarding, or rebound. Skin: Clear, warm, and dry.  Good turgor.  Brisk capillary refill.  Course in Ur[facility]   Given Zofran ODT 8 mg sublingually.   Assessment   The encounter diagnosis was Gastroenteritis.  Probably viral, possibly bacterial, parasitic  illness is less likely. Will obtain stool for O&P parasites, and treat as for viral gastroenteritis.  Plan   1.  Meds:  The following meds were prescribed:   Discharge Medication List as of 06/27/2013  7:56 PM    START taking these medications   Details  diphenoxylate-atropine (LOMOTIL) 2.5-0.025 MG per tablet Take 1 tablet by mouth 4 (four) times daily as needed for diarrhea or loose stools., Starting 06/27/2013, Until Discontinued, Print    ondansetron (ZOFRAN ODT) 8 MG disintegrating tablet Take 1 tablet (8 mg total) by mouth every 8 (eight) hours as needed for nausea., Starting 06/27/2013, Until Discontinued, Normal        2.  Patient Education/Counseling:  The patient was given appropriate handouts, self care instructions, and instructed in symptomatic relief. The patient was told to stay on clear liquids for the remainder of the day, then advance to a B.R.A.T. diet starting tomorrow.   3.  Follow up:  The patient was told to follow up here if no better in 2 to 3 days, or sooner if becoming worse in any way, and given some red flag symptoms such as persistent vomitng, high fever, severe abdominal pain, or any GI bleeding which would prompt immediate return.         Reuben Likesavid C Pauline Trainer, MD 06/27/13 [phone]

## 2013-06-27 NOTE — Discharge Instructions (Signed)

## 2013-07-05 LAB — OVA AND PARASITE EXAMINATION

## 2014-02-08 DIAGNOSIS — E282 Polycystic ovarian syndrome: Secondary | ICD-10-CM | POA: Insufficient documentation

## 2015-03-09 ENCOUNTER — Ambulatory Visit: Admitting: Certified Nurse Midwife

## 2015-03-17 ENCOUNTER — Ambulatory Visit (INDEPENDENT_AMBULATORY_CARE_PROVIDER_SITE_OTHER): Admitting: Licensed Clinical Social Worker

## 2015-03-17 DIAGNOSIS — F331 Major depressive disorder, recurrent, moderate: Secondary | ICD-10-CM | POA: Diagnosis not present

## 2015-03-17 NOTE — Progress Notes (Signed)
Patient:   Katie HinesKristina L Benjamin   DOB:   05-19-1980  MR Number:  [id number]  Location:  [facility] [address] [place] [address] Dept: [phone]           Date of Service:   03/17/2015  Start Time:   11a End Time:   12p  Provider/Observer:  Marinda ElkNicole M Peacock Counselor       Billing Code/Service: [phone]  Behavioral Observation: Katie HinesKristina L Burditt  presents as a 34 y.o.-year-old Caucasian Female who appeared her stated age. her dress was Appropriate and she was Casual and her manners were Appropriate to the situation.  There were not any physical disabilities noted.  she displayed an appropriate level of cooperation and motivation.    Interactions:    Active   Attention:   within normal limits  Memory:   within normal limits  Speech (Volume):  normal  Speech:   normal volume  Thought Process:  Coherent and Relevant  Though Content:  WNL  Orientation:   person, place, time/date and situation  Judgment:   Good  Planning:   Good  Affect:    Depressed  Mood:    Depressed  Insight:   Good  Intelligence:   normal  Chief Complaint:     Chief Complaint  Patient presents with  . Depression  . Stress  . Establish Care    Reason for Service:  "I want to figure out how to be happy with myself. I just don't feel good about myself (health and weight)."  Current Symptoms:  For the last 3 years: Doesn't feel like anything is going right, stressed out, husband in [employer] [place] in [place], has a 34 year old daughter, working as OptometristMary Kay consultant, financial strain, got family in debt, tearful, panic attack, Doctor prescribed Lexapro, eats more, lacks motivation, gained over 50 pounds in 2016, recently diagnosed with Degenerative Disc Disease (does not attend pain management)  Source of Distress:              money  Marital Status/Living: Married for the  past 8 years/lives with her 34 year old Daughter and dog  Employment History: 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 Describes her childhood as "good.  My dad was in the [employer].  We did not really move around much."   Children/Grand-children:    Katie Benjamin 4  Natural/Informal Support:                           Mother, sister, husband, neighbors   Substance Use:  No concerns of substance abuse are reported.  History of alcohol usage as a teenager (Vodka about once weekly "I would drink too much because I did not remember anything" stopped drinking age 34.  Marijuana: while in high school with her boyfriend "just a few times"   Medical History:  No past medical history on file.  Medication List       This list is accurate as of: 03/17/15 11:23 AM.  Always use your most recent med list.               diphenoxylate-atropine 2.5-0.025 MG tablet  Commonly known as:  LOMOTIL  Take 1 tablet by mouth 4 (four) times daily as needed for diarrhea or loose stools.     ondansetron 8 MG disintegrating tablet  Commonly known as:  ZOFRAN ODT  Take 1 tablet (8 mg total) by mouth every 8 (eight) hours as needed for nausea.     phentermine 37.5 MG capsule  Take 37.5 mg by mouth every morning.     prenatal multivitamin Tabs tablet  Take 1 tablet by mouth daily.              Sexual History:   History  Sexual Activity  . Sexual Activity: Not on file     Abuse/Trauma History: Father passed away in 24-Aug-2001; unexpected   Psychiatric History:  Denies    Strengths:   Kind, funny, organized,  crafty   Recovery Goals:  "I want to figure out how to be happy with myself. I just don't feel good about myself (health and weight)."  Hobbies/Interests:               Scrapbooking, crafts, baking, couponing, going to movies   Challenges/Barriers: Weight, overspending, shopping online & putting things on credit cards, being ontime,     Family Med/Psych History: No family history on file.  Risk of Suicide/Violence: virtually non-existent   History of Suicide/Violence:  Denies  Psychosis:   Denies   Diagnosis:    Moderate episode of recurrent major depressive disorder (HCC)  Impression/DX:  Deauna is currently diagnosed with Major Depressive Disorder due to her current symptoms.  She is currently experiencing For the last 3 years: Doesn't feel like anything is going right, stressed out, husband in [employer] stationed in [place] [employer] [place], has a 57 year old daughter, working as Optometrist, financial strain, got family in debt, tearful, panic attack, Doctor prescribed Lexapro, eats more, lacks motivation, gained over 50 pounds in 08/25/2014.  Thresa will be best supported by medication management and outpatient therapy to assist with coping skills and understanding her triggers.  Ladajah does not have a history of SI thoughts; denies currently thoughts.  She has protective factors and several positive relationships.  Recommendation/Plan: Writer recommends Outpatient Therapy at least twice monthly to include but not limited to individual, group and or family therapy.  Medication Management is also recommended to assist with her mood.

## 2015-04-04 ENCOUNTER — Emergency Department (INDEPENDENT_AMBULATORY_CARE_PROVIDER_SITE_OTHER)
Admission: EM | Admit: 2015-04-04 | Discharge: 2015-04-04 | Disposition: A | Discharge status: Home / Self Care | Source: Home / Self Care | Attending: Emergency Medicine | Admitting: Emergency Medicine

## 2015-04-04 ENCOUNTER — Encounter (HOSPITAL_COMMUNITY): Payer: Self-pay | Admitting: Emergency Medicine

## 2015-04-04 DIAGNOSIS — J329 Chronic sinusitis, unspecified: Secondary | ICD-10-CM

## 2015-04-04 DIAGNOSIS — R0982 Postnasal drip: Secondary | ICD-10-CM | POA: Diagnosis not present

## 2015-04-04 DIAGNOSIS — R05 Cough: Secondary | ICD-10-CM | POA: Diagnosis not present

## 2015-04-04 DIAGNOSIS — R059 Cough, unspecified: Secondary | ICD-10-CM

## 2015-04-04 MED ORDER — DOXYCYCLINE HYCLATE 100 MG PO CAPS
100.0000 mg | ORAL_CAPSULE | Freq: Two times a day (BID) | ORAL | Status: DC
Start: 2015-04-04 — End: 2015-04-04

## 2015-04-04 MED ORDER — IBUPROFEN 800 MG PO TABS: 800.0000 mg | ORAL_TABLET | Freq: Three times a day (TID) | ORAL | Status: DC

## 2015-04-04 MED ORDER — MOMETASONE FUROATE 50 MCG/ACT NA SUSP: 2.0000 | Freq: Every day | NASAL | Status: DC

## 2015-04-04 MED ORDER — HYDROCOD POLST-CPM POLST ER 10-8 MG/5ML PO SUER: 5.0000 mL | Freq: Two times a day (BID) | ORAL | Status: DC | PRN

## 2015-04-04 MED ORDER — DOXYCYCLINE HYCLATE 100 MG PO CAPS: 100.0000 mg | ORAL_CAPSULE | Freq: Two times a day (BID) | ORAL | Status: DC

## 2015-04-04 MED ORDER — PSEUDOEPHEDRINE-GUAIFENESIN ER 120-1200 MG PO TB12
1.0000 | ORAL_TABLET | Freq: Two times a day (BID) | ORAL | Status: DC | PRN
Start: 2015-04-04 — End: 2015-04-04

## 2015-04-04 NOTE — ED Provider Notes (Signed)
HPI  SUBJECTIVE:  Katie Benjamin is a 34 y.o. female who presents with nonproductive, dry cough for one month. She reports rhinorrhea, postnasal drip, for the past 3 weeks. She reports substernal chest pain described as soreness and throat irritation. She denies any other chest pain. She reports frontal sinus headache for the past 3 days but denies sinus pain/pressure. She reports yellowish nasal drainage. Her upper teeth do not hurt. She reports one episode of right ear pain this morning this has since resolved. No change in hearing. No nausea, vomiting, fevers, other chest pain, abdominal pain. No acid reflux type symptoms. She states that this started off with allergy symptoms with itchy, watery eyes, sneezing, but this has also resolved. No flulike symptoms. Patient unable to sleep at night due to coughing. She has been taking Mucinex and Benadryl. There are no other aggravating or alleviating factors. Past medical history negative for seasonal allergies, asthma, emphysema, COPD, diabetes, hypertension, sinusitis, tobacco use. LMP 11/27. PMD: Allyne Gee.    History reviewed. No pertinent past medical history.  Past Surgical History  Procedure Laterality Date  . Cesarean section      No family history on file.  Social History  Substance Use Topics  . Smoking status: Never Smoker   . Smokeless tobacco: None  . Alcohol Use: No    No current facility-administered medications for this encounter.  Current outpatient prescriptions:  .  chlorpheniramine-HYDROcodone (TUSSIONEX PENNKINETIC ER) 10-8 MG/5ML SUER, Take 5 mLs by mouth every 12 (twelve) hours as needed for cough., Disp: 120 mL, Rfl: 0 .  diphenoxylate-atropine (LOMOTIL) 2.5-0.025 MG per tablet, Take 1 tablet by mouth 4 (four) times daily as needed for diarrhea or loose stools., Disp: 16 tablet, Rfl: 0 .  doxycycline (VIBRAMYCIN) 100 MG capsule, Take 1 capsule (100 mg total) by mouth 2 (two) times daily. X 7 days, Disp: 14 capsule,  Rfl: 0 .  ibuprofen (ADVIL,MOTRIN) 800 MG tablet, Take 1 tablet (800 mg total) by mouth 3 (three) times daily., Disp: 30 tablet, Rfl: 0 .  mometasone (NASONEX) 50 MCG/ACT nasal spray, Place 2 sprays into the nose daily., Disp: 17 g, Rfl: 0 .  ondansetron (ZOFRAN ODT) 8 MG disintegrating tablet, Take 1 tablet (8 mg total) by mouth every 8 (eight) hours as needed for nausea., Disp: 20 tablet, Rfl: 0 .  phentermine 37.5 MG capsule, Take 37.5 mg by mouth every morning., Disp: , Rfl:  .  Prenatal Vit-Fe Fumarate-FA (PRENATAL MULTIVITAMIN) TABS, Take 1 tablet by mouth daily., Disp: , Rfl:   No Known Allergies   ROS  As noted in HPI.   Physical Exam  BP 166/79 mmHg  Pulse 78  Temp(Src) 97.7 F (36.5 C) (Oral)  Resp 12  SpO2 97%  LMP 03/07/2015  Constitutional: Well developed, well nourished, no acute distress Eyes:  EOMI, conjunctiva normal bilaterally HENT: Normocephalic, atraumatic,mucus membranes moist. TMs normal bilaterally. Positive nasal congestion, positive erythematous turbinates. No sinus tenderness. Normal oropharynx. Positive postnasal drip. Uvula midline Lymph: No cervical lymphadenopathy Respiratory: Normal inspiratory effort lungs clear bilaterally good air movement  Cardiovascular: Normal rate regular rhythm no murmurs, rubs, gallops  GI: nondistended skin: No rash, skin intact Musculoskeletal: no deformities Neurologic: Alert & oriented x 3, no focal neuro deficits Psychiatric: Speech and behavior appropriate   ED Course   Medications - No data to display  No orders of the defined types were placed in this encounter.    No results found for this or any previous visit (from the  past 24 hour(s)). No results found.  ED Clinical Impression  Rhinosinusitis  Cough  Postnasal drip   ED Assessment/Plan  No evidence of otitis, sinusitis. Seriously doubt pneumonia given the absence of fevers, however, she has had symptoms for 3 weeks now and is getting  worse. We'll treat with saline nasal irrigation, nasal steroid, antihistamine, Tussionex, ibuprofen 800 mg, wait-and-see prescription of doxycycline which would cover sinusitis and pneumonia. Follow-up with primary care physician as needed.  Discussed MDM, plan and followup with patient. Discussed sn/sx that should prompt return to the UC or ED. Patient agrees with plan.   *This clinic note was created using Dragon dictation software. Therefore, there may be occasional mistakes despite careful proofreading.  ?   Domenick GongAshley Tiearra Colwell, MD 04/04/15 [phone]

## 2015-04-04 NOTE — Discharge Instructions (Signed)
Take the medication as written. Return if you get worse, have a fever >100.4, or for any concerns. You may take 800 mg of motrin with 1 gram of tylenol up to 3 times a day as needed for pain. This is an effective combination for pain. Use a neti pot or the NeilMed sinus rinse as often as you want to to reduce nasal congestion. Follow the directions on the box.    Start some Claritin, Zyrtec, or Allegra rather than the Mucinex D.   Go to www.goodrx.com to look up your medications. This will give you a list of where you can find your prescriptions at the most affordable prices.

## 2015-04-04 NOTE — ED Notes (Signed)
Here with cough, nasal congestion and now sinus sx's that started x 3 weeks Taking Mucinex and allergy medication

## 2015-04-11 ENCOUNTER — Encounter: Payer: Self-pay | Admitting: Psychiatry

## 2015-04-11 ENCOUNTER — Ambulatory Visit (INDEPENDENT_AMBULATORY_CARE_PROVIDER_SITE_OTHER): Admitting: Psychiatry

## 2015-04-11 VITALS — BP 140/88 | HR 93 | Temp 97.2°F | Ht 67.0 in | Wt 288.0 lb

## 2015-04-11 DIAGNOSIS — F331 Major depressive disorder, recurrent, moderate: Secondary | ICD-10-CM | POA: Diagnosis not present

## 2015-04-11 MED ORDER — TOPIRAMATE 50 MG PO TABS: 50.0000 mg | ORAL_TABLET | Freq: Every day | ORAL | Status: DC

## 2015-04-11 MED ORDER — ESCITALOPRAM OXALATE 10 MG PO TABS: 15.0000 mg | ORAL_TABLET | Freq: Every day | ORAL | Status: DC

## 2015-04-11 NOTE — Progress Notes (Signed)
Psychiatric Initial Adult Assessment   Patient Identification: Katie Benjamin MRN:  [id number] Date of Evaluation:  04/11/2015 Referral Source: Dr Allyne GeeGinette Otto.  Chief Complaint:   Chief Complaint    Establish Care; Depression     Visit Diagnosis:    ICD-9-CM ICD-10-CM   1. MDD (major depressive disorder), recurrent episode, moderate (HCC) 296.32 F33.1    Diagnosis:   Patient Active Problem List   Diagnosis Date Noted  . Bilateral polycystic ovarian syndrome [E28.2] 02/08/2014   History of Present Illness:    Patient is a 35 year old married female who presented for initial assessment. She was referred by her primary care physician. Patient reported history of depression which started in October 2015. 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 She reported that her husband works in [place] and she had a 70-year-old daughter at that time. She became stressed and started having anxiety. Patient reported that she was unable to reach her personal goals and was losing control of her job as well as her home situation. Patient started feeling anxious and she was putting her personal money to meet her goals. Patient stated that she was having social anxiety. She was finally able to quit her job when her husband found out about her job as well as her money situation. She became progressively more depressed. She has been taking Lexapro 10 mg as prescribed by her primary care physician. They also tested her for the other medications through the DNA testing. However she did not titrate the dose of Lexapro. She was started on vitamin D. Patient reported that she continues to feel depressed and anxious and has not been sleeping well.  She is currently working in an office and her daughter is going to school as she is 69 years old. Patient reported that she continues to have headaches with some flashing lights in her eyes. She  has been referred to the eye doctor. She reported that she also tried phentermine for weight loss for almost 6 months but it did not help. She reported that she has irregular menstrual periods and has a questionable diagnosis of polycystic ovarian syndrome.  She currently denied using any drugs or alcohol. She denied having any suicidal ideations or plans.  Elements:  Duration:  continous. Associated Signs/Symptoms: Depression Symptoms:  depressed mood, anhedonia, insomnia, psychomotor agitation, fatigue, feelings of worthlessness/guilt, difficulty concentrating, hopelessness, anxiety, panic attacks, loss of energy/fatigue, disturbed sleep, weight gain, increased appetite, (Hypo) Manic Symptoms:  denied Anxiety Symptoms:  Excessive Worry, Social Anxiety, Psychotic Symptoms:  none PTSD Symptoms: Negative NA  Past Medical History:  Past Medical History  Diagnosis Date  . Anxiety   . Depression     Past Surgical History  Procedure Laterality Date  . Cesarean section     Family History:  Family History  Problem Relation Age of Onset  . Hypertension Mother   . Diabetes Mother   . Heart attack Father   . Hypertension Father   . Hyperlipidemia Father   . Alcohol abuse Father   . Menstrual problems Sister   . Alcohol abuse Sister   . Drug abuse Sister   . Obesity Sister   . Obesity Sister   . Depression Sister    Social History:   Social History   Social History  . Marital Status: Married    Spouse Name: N/A  . Number of Children: N/A  . Years of Education:  N/A   Social History Main Topics  . Smoking status: Never Smoker   . Smokeless tobacco: Never Used  . Alcohol Use: No  . Drug Use: No  . Sexual Activity: Yes    Birth Control/ Protection: None   Other Topics Concern  . None   Social History Narrative   Additional Social History:  Patient is currently married since 2008. Her husband works in [employer] and is currently posted in [place]. She  has a 35-year-old daughter.  Musculoskeletal: Strength & Muscle Tone: within normal limits Gait & Station: normal Patient leans: N/A  Psychiatric Specialty Exam: HPI  ROS  Blood pressure 140/88, pulse 93, temperature 97.2 F (36.2 C), temperature source Tympanic, height 5\' 7"  (1.702 m), weight 288 lb (130.636 kg), last menstrual period 03/07/2015, SpO2 96 %.Body mass index is 45.1 kg/(m^2).  General Appearance: Casual  Eye Contact:  Fair  Speech:  Clear and Coherent  Volume:  Decreased  Mood:  Depressed  Affect:  Tearful  Thought Process:  Coherent  Orientation:  Full (Time, Place, and Person)  Thought Content:  WDL  Suicidal Thoughts:  No  Homicidal Thoughts:  No  Memory:  Immediate;   Fair  Judgement:  Fair  Insight:  Fair  Psychomotor Activity:  Psychomotor Retardation  Concentration:  Fair  Recall:  [employer]  Fund of Knowledge:Fair  Language: Fair  Akathisia:  No  Handed:  Right  AIMS (if indicated):    Assets:  Communication Skills Desire for Improvement Physical Health Social Support  ADL's:  Intact  Cognition: WNL  Sleep:  6-8   Is the patient at risk to self?  No. Has the patient been a risk to self in the past 6 months?  No. Has the patient been a risk to self within the distant past?  No. Is the patient a risk to others?  No. Has the patient been a risk to others in the past 6 months?  No. Has the patient been a risk to others within the distant past?  No.  Allergies:  No Known Allergies Current Medications: Current Outpatient Prescriptions  Medication Sig Dispense Refill  . Ascorbic Acid (VITAMIN C) 1000 MG tablet Take by mouth.    . chlorpheniramine-HYDROcodone (TUSSIONEX PENNKINETIC ER) 10-8 MG/5ML SUER Take 5 mLs by mouth every 12 (twelve) hours as needed for cough. 120 mL 0  . doxycycline (VIBRAMYCIN) 100 MG capsule Take 1 capsule (100 mg total) by mouth 2 (two) times daily. X 7 days 14 capsule 0  . escitalopram (LEXAPRO) 10 MG tablet     . ibuprofen  (ADVIL,MOTRIN) 800 MG tablet Take 1 tablet (800 mg total) by mouth 3 (three) times daily. 30 tablet 0  . ondansetron (ZOFRAN ODT) 8 MG disintegrating tablet Take 1 tablet (8 mg total) by mouth every 8 (eight) hours as needed for nausea. 20 tablet 0  . Vitamin D, Ergocalciferol, (DRISDOL) 50000 units CAPS capsule take 1 capsule by mouth ON TUESDAY AND ON FRIDAY  0   No current facility-administered medications for this visit.    Previous Psychotropic Medications:  Phenteramine for weight loss.  She denied any previous history of suicide attempts. She denied any history of psychiatric  Hospitalization.   Substance Abuse History in the last 12 months:  No.  Consequences of Substance Abuse: Negative NA  Medical Decision Making:  Review of Psycho-Social Stressors (1)  Treatment Plan Summary: Medication management   Discussed with patient about the medications and I will titrate the dose of  Lexapro 15 mg by mouth daily She also be started on Topamax 50 mg daily at bedtime to help with her migraine headaches as well as with weight loss Advised her to follow up with the OB/GYN to have a diagnosis of PCO as as her blood pressure is currently elevated as well as she has history of irregular periods She agreed with the plan Follow-up in 5 weeks   Brandy Hale, MD     1/3/20171:20 PM

## 2015-05-01 ENCOUNTER — Ambulatory Visit (INDEPENDENT_AMBULATORY_CARE_PROVIDER_SITE_OTHER): Admitting: Licensed Clinical Social Worker

## 2015-05-01 DIAGNOSIS — F331 Major depressive disorder, recurrent, moderate: Secondary | ICD-10-CM | POA: Diagnosis not present

## 2015-05-08 NOTE — Progress Notes (Signed)
   THERAPIST PROGRESS NOTE  Session Time:  Participation Level: Active  Behavioral Response: CasualAlertAnxious  Type of Therapy: Individual Therapy  Treatment Goals addressed: Coping  Interventions: CBT, Motivational Interviewing, Solution Focused, Supportive and Reframing  Summary: Katie Benjamin is a 35 y.o. female who presents with continued symptoms of her diagnosis.  She reports that she has utilized Pharmacologist taught previously which has assisted her with losing weight.  She is currently working which has also decreased her anxiety.  She reports that she continues to have concerns in her marriage and occasionally spending money excessively.  She reports that she wants to open communication lines with her husband but is unsure how to.  Role played communication skills.   Suicidal/Homicidal: Nowithout intent/plan  Therapist Response: LCSW provided Patient with ongoing emotional support and encouragement.  Normalized her feelings.  Commended Patient on her progress and reinforced the importance of client staying focused on her own strengths and resources and resiliency. Processed various strategies for dealing with stressors.    Plan: Return again in 2 weeks.  Diagnosis: Axis I: Depression    Axis II: No diagnosis     Nolon Rod 05/01/2015

## 2015-05-23 ENCOUNTER — Ambulatory Visit: Admitting: Psychiatry

## 2015-06-01 ENCOUNTER — Ambulatory Visit: Admitting: Licensed Clinical Social Worker

## 2019-04-09 DIAGNOSIS — D649 Anemia, unspecified: Secondary | ICD-10-CM

## 2019-04-09 HISTORY — DX: Anemia, unspecified: D64.9

## 2019-04-21 ENCOUNTER — Emergency Department (HOSPITAL_COMMUNITY)

## 2019-04-21 ENCOUNTER — Other Ambulatory Visit: Payer: Self-pay

## 2019-04-21 ENCOUNTER — Emergency Department (HOSPITAL_COMMUNITY)
Admission: EM | Admit: 2019-04-21 | Discharge: 2019-04-22 | Disposition: A | Discharge status: Home / Self Care | Attending: Emergency Medicine | Admitting: Emergency Medicine

## 2019-04-21 ENCOUNTER — Encounter (HOSPITAL_COMMUNITY): Payer: Self-pay | Admitting: Emergency Medicine

## 2019-04-21 DIAGNOSIS — G9689 Other specified disorders of central nervous system: Secondary | ICD-10-CM

## 2019-04-21 DIAGNOSIS — G932 Benign intracranial hypertension: Secondary | ICD-10-CM | POA: Insufficient documentation

## 2019-04-21 DIAGNOSIS — H4710 Unspecified papilledema: Secondary | ICD-10-CM | POA: Diagnosis present

## 2019-04-21 DIAGNOSIS — D649 Anemia, unspecified: Secondary | ICD-10-CM | POA: Diagnosis not present

## 2019-04-21 LAB — CBC WITH DIFFERENTIAL/PLATELET
Abs Immature Granulocytes: 0.04 10*3/uL (ref 0.00–0.07)
Basophils Absolute: 0.1 10*3/uL (ref 0.0–0.1)
Basophils Relative: 1 %
Eosinophils Absolute: 0.2 10*3/uL (ref 0.0–0.5)
Eosinophils Relative: 3 %
HCT: 30 % — ABNORMAL LOW (ref 36.0–46.0)
Hemoglobin: 8.5 g/dL — ABNORMAL LOW (ref 12.0–15.0)
Immature Granulocytes: 1 %
Lymphocytes Relative: 23 %
Lymphs Abs: 1.9 10*3/uL (ref 0.7–4.0)
MCH: 22 pg — ABNORMAL LOW (ref 26.0–34.0)
MCHC: 28.3 g/dL — ABNORMAL LOW (ref 30.0–36.0)
MCV: 77.5 fL — ABNORMAL LOW (ref 80.0–100.0)
Monocytes Absolute: 0.6 10*3/uL (ref 0.1–1.0)
Monocytes Relative: 7 %
Neutro Abs: 5.5 10*3/uL (ref 1.7–7.7)
Neutrophils Relative %: 65 %
Platelets: 459 10*3/uL — ABNORMAL HIGH (ref 150–400)
RBC: 3.87 MIL/uL (ref 3.87–5.11)
RDW: 18.6 % — ABNORMAL HIGH (ref 11.5–15.5)
WBC: 8.3 10*3/uL (ref 4.0–10.5)
nRBC: 0 % (ref 0.0–0.2)

## 2019-04-21 LAB — BASIC METABOLIC PANEL
Anion gap: 9 (ref 5–15)
BUN: 12 mg/dL (ref 6–20)
CO2: 24 mmol/L (ref 22–32)
Calcium: 8.8 mg/dL — ABNORMAL LOW (ref 8.9–10.3)
Chloride: 109 mmol/L (ref 98–111)
Creatinine, Ser: 0.71 mg/dL (ref 0.44–1.00)
GFR calc Af Amer: >60 mL/min (ref >60)
GFR calc non Af Amer: >60 mL/min (ref >60)
Glucose, Bld: 100 mg/dL — ABNORMAL HIGH (ref 70–99)
Potassium: 3.9 mmol/L (ref 3.5–5.1)
Sodium: 142 mmol/L (ref 135–145)

## 2019-04-21 LAB — I-STAT BETA HCG BLOOD, ED (MC, WL, AP ONLY): I-stat hCG, quantitative: <5 m[IU]/mL (ref <5)

## 2019-04-21 MED ORDER — GADOBUTROL 1 MMOL/ML IV SOLN
10.0000 mL | Freq: Once | INTRAVENOUS | Status: AC | PRN
  Administered 2019-04-21: 10 mL via INTRAVENOUS

## 2019-04-21 NOTE — ED Notes (Signed)
Contacted MRI after pt was unable to be located in the lobby; pt had went home after MRI study. I called pt and asked her to return for abnormal results after consulting with Dr.Long. Pt lives away and will return

## 2019-04-21 NOTE — ED Triage Notes (Signed)
Pt sent from eye doc with reports of optic nerve edema. Requesting MRIs and neuro consult. Pt reports she has been having headaches.

## 2019-04-21 NOTE — ED Provider Notes (Signed)
[facility] EMERGENCY DEPARTMENT Provider Note   CSN: [id number] Arrival date & time: 04/21/19  1700     History Chief Complaint  Patient presents with  . optic nerve edema    Katie Benjamin is a 39 y.o. female history of obesity, PCOS, anxiety, depression.  Patient presents today for concern of optic nerve edema.  She was seen at her eye doctor today for a routine visit when they noted swelling of the optic nerve bilaterally and sent her to the ER for further evaluation.  Patient reports mild left-sided throbbing headache x1 month improved with Aleve, no aggravating factors, nonradiating.  She reports this headache did not bother her and she was not seeking medical attention for the headache today.  Denies fever/chills, fall/injury, vision changes, eye pain, neck stiffness, chest pain/shortness of breath, nausea/vomiting, numbness/tingling, weakness, balance issues, speech difficulty, facial droop or any additional concerns. HPI     Past Medical History:  Diagnosis Date  . Anxiety   . Depression     Patient Active Problem List   Diagnosis Date Noted  . Bilateral polycystic ovarian syndrome 02/08/2014    Past Surgical History:  Procedure Laterality Date  . CESAREAN SECTION       OB History   No obstetric history on file.     Family History  Problem Relation Age of Onset  . Hypertension Mother   . Diabetes Mother   . Heart attack Father   . Hypertension Father   . Hyperlipidemia Father   . Alcohol abuse Father   . Menstrual problems Sister   . Alcohol abuse Sister   . Drug abuse Sister   . Obesity Sister   . Obesity Sister   . Depression Sister     Social History   Tobacco Use  . Smoking status: Never Smoker  . Smokeless tobacco: Never Used  Substance Use Topics  . Alcohol use: No    Alcohol/week: 0.0 standard drinks  . Drug use: No    Home Medications Prior to Admission medications   Medication Sig Start Date End Date  Taking? Authorizing Provider  Ascorbic Acid (VITAMIN C) 1000 MG tablet Take 1,000 mg by mouth daily.    Yes [provider]  Cholecalciferol (VITAMIN D3) 50 MCG (2000 UT) TABS Take 2,000 Units by mouth daily.   Yes [provider]  escitalopram (LEXAPRO) 20 MG tablet Take 20 mg by mouth at bedtime.   Yes [provider]  ibuprofen (ADVIL) 200 MG tablet Take 600-800 mg by mouth See admin instructions. Take 800 mg by mouth in the morning with breakfast, 600 mg with lunch, and 600 mg with supper (evening meal)   Yes [provider]  Multiple Vitamins-Minerals (ONE-A-DAY WOMENS PO) Take 1 tablet by mouth daily with breakfast.   Yes [provider]  pseudoephedrine-acetaminophen (TYLENOL SINUS) 30-500 MG TABS tablet Take 1 tablet by mouth daily as needed (for sinus symptoms).    Yes [provider]  acetaZOLAMIDE (DIAMOX) 250 MG tablet Take 1 tablet (250 mg total) by mouth 2 (two) times daily for 7 days, THEN 2 tablets (500 mg total) 2 (two) times daily for 10 days. 04/22/19 05/09/19  Harlene Salts A, PA-C  chlorpheniramine-HYDROcodone (TUSSIONEX PENNKINETIC ER) 10-8 MG/5ML SUER Take 5 mLs by mouth every 12 (twelve) hours as needed for cough. Patient not taking: Reported on 04/21/2019 04/04/15   Domenick Gong, MD  doxycycline (VIBRAMYCIN) 100 MG capsule Take 1 capsule (100 mg total) by mouth 2 (  two) times daily. X 7 days Patient not taking: Reported on 04/21/2019 04/04/15   Domenick GongMortenson, Ashley, MD  escitalopram (LEXAPRO) 10 MG tablet Take 1.5 tablets (15 mg total) by mouth daily. Patient not taking: Reported on 04/21/2019 04/11/15   Brandy HaleFaheem, Uzma, MD  ibuprofen (ADVIL,MOTRIN) 800 MG tablet Take 1 tablet (800 mg total) by mouth 3 (three) times daily. Patient not taking: Reported on 04/21/2019 04/04/15   Domenick GongMortenson, Ashley, MD  ondansetron (ZOFRAN ODT) 8 MG disintegrating tablet Take 1 tablet (8 mg total) by mouth every 8 (eight) hours as needed for  nausea. Patient not taking: Reported on 04/21/2019 06/27/13   Reuben LikesKeller, David C, MD  topiramate (TOPAMAX) 50 MG tablet Take 1 tablet (50 mg total) by mouth daily. Patient not taking: Reported on 04/21/2019 04/11/15   Brandy HaleFaheem, Uzma, MD    Allergies    Patient has no known allergies.  Review of Systems   Review of Systems Ten systems are reviewed and are negative for acute change except as noted in the HPI  Physical Exam Updated Vital Signs BP (!) 144/57 (BP Location: Right Arm)   Pulse 80   Temp 98.1 F (36.7 C) (Oral)   Resp 18   SpO2 97%   Physical Exam Constitutional:      General: She is not in acute distress.    Appearance: Normal appearance. She is well-developed. She is not ill-appearing or diaphoretic.  HENT:     Head: Normocephalic and atraumatic.     Right Ear: External ear normal.     Left Ear: External ear normal.     Nose: Nose normal.  Eyes:     General: Vision grossly intact. Gaze aligned appropriately.     Pupils: Pupils are equal, round, and reactive to light.  Neck:     Trachea: Trachea and phonation normal. No tracheal deviation.  Pulmonary:     Effort: Pulmonary effort is normal. No respiratory distress.  Abdominal:     General: There is no distension.     Palpations: Abdomen is soft.     Tenderness: There is no abdominal tenderness. There is no guarding or rebound.  Musculoskeletal:        General: Normal range of motion.     Cervical back: Normal range of motion.  Skin:    General: Skin is warm and dry.  Neurological:     Mental Status: She is alert.     GCS: GCS eye subscore is 4. GCS verbal subscore is 5. GCS motor subscore is 6.     Comments: Mental Status: Alert, oriented, thought content appropriate, able to give a coherent history. Speech fluent without evidence of aphasia. Able to follow 2 step commands without difficulty. Cranial Nerves: II: Peripheral visual fields grossly normal, pupils equal, round, reactive to light III,IV, VI: ptosis  not present, extra-ocular motions intact bilaterally V,VII: smile symmetric, eyebrows raise symmetric, facial light touch sensation equal VIII: hearing grossly normal to voice X: uvula elevates symmetrically XI: bilateral shoulder shrug symmetric and strong XII: midline tongue extension without fassiculations Motor: Normal tone. 5/5 strength in upper and lower extremities bilaterally including strong and equal grip strength and dorsiflexion/plantar flexion Sensory: Sensation intact to light touch in all extremities.Negative Romberg.  Deep Tendon Reflexes: 2+ patellas, no clonus of the feet Cerebellar: normal finger-to-nose with bilateral upper extremities. Normal heel-to -shin balance bilaterally of the lower extremity. No pronator drift.  Gait: normal gait and balance CV: distal pulses palpable throughout  Psychiatric:  Behavior: Behavior normal.    ED Results / Procedures / Treatments   Labs (all labs ordered are listed, but only abnormal results are displayed) Labs Reviewed  BASIC METABOLIC PANEL - Abnormal; Notable for the following components:      Result Value   Glucose, Bld 100 (*)    Calcium 8.8 (*)    All other components within normal limits  CBC WITH DIFFERENTIAL/PLATELET - Abnormal; Notable for the following components:   Hemoglobin 8.5 (*)    HCT 30.0 (*)    MCV 77.5 (*)    MCH 22.0 (*)    MCHC 28.3 (*)    RDW 18.6 (*)    Platelets 459 (*)    All other components within normal limits  CSF CULTURE  GLUCOSE, CSF  PROTEIN, CSF  CSF CELL COUNT WITH DIFFERENTIAL  I-STAT BETA HCG BLOOD, ED ([facility], [facility], [facility] ONLY)    EKG None  Radiology MR Brain W and Wo Contrast  Result Date: 04/21/2019 CLINICAL DATA:  Initial evaluation for acute headache, optic nerve edema on physical exam today. EXAM: MRI HEAD AND ORBITS WITHOUT AND WITH CONTRAST TECHNIQUE: Multiplanar, multiecho pulse sequences of the brain and surrounding structures were obtained without and with  intravenous contrast. Multiplanar, multiecho pulse sequences of the orbits and surrounding structures were obtained including fat saturation techniques, before and after intravenous contrast administration. CONTRAST:  38mL GADAVIST GADOBUTROL 1 MMOL/ML IV SOLN COMPARISON:  None available. FINDINGS: MRI HEAD FINDINGS Brain: Cerebral volume within normal limits for age. No focal parenchymal signal abnormality. No abnormal foci of restricted diffusion to suggest acute or subacute ischemia. Gray-white matter differentiation maintained. No encephalomalacia to suggest chronic cortical infarction. No foci of susceptibility artifact to suggest acute or chronic intracranial hemorrhage. No mass lesion, midline shift or mass effect. No hydrocephalus. No extra-axial fluid collection. No abnormal enhancement within the brain. Empty sella noted.  Midline structures intact. Vascular: Major intracranial vascular flow voids are well maintained. Skull and upper cervical spine: Craniocervical junction within normal limits. Upper cervical spine normal. Bone marrow signal intensity within normal limits. No scalp soft tissue abnormality. Other: Mastoid air cells are clear. Inner ear structures grossly normal. MRI ORBITS FINDINGS Orbits: Globes are symmetric in size with normal appearance and morphology bilaterally. Increased CSF fluid signal seen along the optic nerve sheaths bilaterally. Suggestion of slight bulging of the optic nerve discs at the posterior aspect of the globes. Optic nerves themselves are normal in appearance. No abnormal optic nerve enhancement. No abnormality about the orbital apex. Cavernous sinus within normal limits. Optic chiasm normally situated within the suprasellar cistern. Visualized optic radiations normal. Intraconal and extraconal fat well-maintained. Extra-ocular muscles normal. Normal lacrimal glands. Superior orbital veins symmetric and normal. Visualized sinuses: Visualized paranasal sinuses are  clear. Soft tissues: Unremarkable. IMPRESSION: 1. Empty sella with increased CSF signal intensity along the optic nerve sheaths and suggestion of slight bulging of the optic nerve discs at the posterior aspect of the globes. Findings suggestive of increased intracranial pressure, and can be seen in the setting of idiopathic intracranial hypertension (pseudotumor cerebri). Correlation with LP and opening pressure suggested for correlated purposes. 2. Otherwise unremarkable and normal MRI of the brain and orbits. Electronically Signed   By: Rise Mu M.D.   On: 04/21/2019 20:17   MR ORBITS W WO CONTRAST  Result Date: 04/21/2019 CLINICAL DATA:  Initial evaluation for acute headache, optic nerve edema on physical exam today. EXAM: MRI HEAD AND ORBITS WITHOUT AND WITH CONTRAST TECHNIQUE: Multiplanar,  multiecho pulse sequences of the brain and surrounding structures were obtained without and with intravenous contrast. Multiplanar, multiecho pulse sequences of the orbits and surrounding structures were obtained including fat saturation techniques, before and after intravenous contrast administration. CONTRAST:  65mL GADAVIST GADOBUTROL 1 MMOL/ML IV SOLN COMPARISON:  None available. FINDINGS: MRI HEAD FINDINGS Brain: Cerebral volume within normal limits for age. No focal parenchymal signal abnormality. No abnormal foci of restricted diffusion to suggest acute or subacute ischemia. Gray-white matter differentiation maintained. No encephalomalacia to suggest chronic cortical infarction. No foci of susceptibility artifact to suggest acute or chronic intracranial hemorrhage. No mass lesion, midline shift or mass effect. No hydrocephalus. No extra-axial fluid collection. No abnormal enhancement within the brain. Empty sella noted.  Midline structures intact. Vascular: Major intracranial vascular flow voids are well maintained. Skull and upper cervical spine: Craniocervical junction within normal limits. Upper  cervical spine normal. Bone marrow signal intensity within normal limits. No scalp soft tissue abnormality. Other: Mastoid air cells are clear. Inner ear structures grossly normal. MRI ORBITS FINDINGS Orbits: Globes are symmetric in size with normal appearance and morphology bilaterally. Increased CSF fluid signal seen along the optic nerve sheaths bilaterally. Suggestion of slight bulging of the optic nerve discs at the posterior aspect of the globes. Optic nerves themselves are normal in appearance. No abnormal optic nerve enhancement. No abnormality about the orbital apex. Cavernous sinus within normal limits. Optic chiasm normally situated within the suprasellar cistern. Visualized optic radiations normal. Intraconal and extraconal fat well-maintained. Extra-ocular muscles normal. Normal lacrimal glands. Superior orbital veins symmetric and normal. Visualized sinuses: Visualized paranasal sinuses are clear. Soft tissues: Unremarkable. IMPRESSION: 1. Empty sella with increased CSF signal intensity along the optic nerve sheaths and suggestion of slight bulging of the optic nerve discs at the posterior aspect of the globes. Findings suggestive of increased intracranial pressure, and can be seen in the setting of idiopathic intracranial hypertension (pseudotumor cerebri). Correlation with LP and opening pressure suggested for correlated purposes. 2. Otherwise unremarkable and normal MRI of the brain and orbits. Electronically Signed   By: Rise Mu M.D.   On: 04/21/2019 20:17    Procedures Procedures (including critical care time)    Medications Ordered in ED Medications  gadobutrol (GADAVIST) 1 MMOL/ML injection 10 mL (10 mLs Intravenous Contrast Given 04/21/19 1947)    ED Course  I have reviewed the triage vital signs and the nursing notes.  Pertinent labs & imaging results that were available during my care of the patient were reviewed by me and considered in my medical decision making  (see chart for details).  Clinical Course as of Apr 21 122  Thu Apr 22, 2019  1324 Diamox 250 bid x 1 week, 500 bid  Cell count gram stain glucose protein Dr. Lucia Gaskins   [BM]    Clinical Course User Index [BM] Elizabeth Palau   MDM Rules/Calculators/A&P                     Triage work-up:  Beta-hCG negative BMP glucose 100, calcium 8.8, otherwise within normal limits, nonacute CBC hemoglobin 8.5, microcytic anemia, asymptomatic.  Patient reports that she is being followed by her PCP at this time for anemia due to menstrual bleeding, she has no symptoms suggestive of GI bleed. No indication for transfusion.  MRI Brain/Orbits:  IMPRESSION:  1. Empty sella with increased CSF signal intensity along the optic  nerve sheaths and suggestion of slight bulging of the optic nerve  discs at the posterior aspect of the globes. Findings suggestive of  increased intracranial pressure, and can be seen in the setting of  idiopathic intracranial hypertension (pseudotumor cerebri).  Correlation with LP and opening pressure suggested for correlated  purposes.  2. Otherwise unremarkable and normal MRI of the brain and orbits.   10:50 PM: On evaluation patient well-appearing no acute distress reports she feels well no complaints.  Cranial nerves intact, no meningeal signs, neuro exam without deficit.  She states understanding of care plan and is agreeable to LP. - Lumbar puncture performed by Dr. Laverta Baltimore, please see his note for procedure details, opening pressure 41 decreased to 17. - Discussed case with neurologist Dr. Malen Gauze, advises Diamox 250 mg twice daily x1 week followed by 500 mg twice daily thereafter.  He also advises to send cell count, Gram stain, glucose and protein of CSF.  Follow-up with Dr. Lavell Anchors next week as outpatient. - 1:20 AM: Patient reassessed resting comfortably no acute distress she has no questions or concerns she states understanding of work-up as above and has no  questions.  She is agreeable for discharge and outpatient follow-up.  At this time there does not appear to be any evidence of an acute emergency medical condition and the patient appears stable for discharge with appropriate outpatient follow up. Diagnosis was discussed with patient who verbalizes understanding of care plan and is agreeable to discharge. I have discussed return precautions with patient who verbalizes understanding of return precautions. Patient encouraged to follow-up with their PCP and neurology. All questions answered.  Patient seen and evaluated by Dr. Laverta Baltimore during this visit.  Note: Portions of this report may have been transcribed using voice recognition software. Every effort was made to ensure accuracy; however, inadvertent computerized transcription errors may still be present. Final Clinical Impression(s) / ED Diagnoses Final diagnoses:  Increased cerebrospinal fluid pressure  Anemia, unspecified type    Rx / DC Orders ED Discharge Orders         Ordered    acetaZOLAMIDE (DIAMOX) 250 MG tablet     04/22/19 0115    Ambulatory referral to Neurology    Comments: An appointment is requested in approximately: 1 week with Dr. Jaynee Eagles   04/21/19 2245           Deliah Boston, PA-C 04/22/19 0125    Margette Fast, MD 04/22/19 1243

## 2019-04-21 NOTE — ED Notes (Signed)
Pt signed consent for Lumbar Puncture.

## 2019-04-21 NOTE — ED Notes (Signed)
Patient called x 3, no answer

## 2019-04-22 LAB — CSF CELL COUNT WITH DIFFERENTIAL
RBC Count, CSF: 2 /mm3 — ABNORMAL HIGH
Tube #: 3
WBC, CSF: 1 /mm3 (ref 0–5)

## 2019-04-22 LAB — PROTEIN, CSF: Total  Protein, CSF: 17 mg/dL (ref 15–45)

## 2019-04-22 LAB — GLUCOSE, CSF: Glucose, CSF: 63 mg/dL (ref 40–70)

## 2019-04-22 MED ORDER — ACETAZOLAMIDE 250 MG PO TABS: ORAL_TABLET | ORAL | 0 refills | Status: DC

## 2019-04-22 NOTE — Discharge Instructions (Addendum)
You have been diagnosed today with increased intracerebral pressure, anemia.  At this time there does not appear to be the presence of an emergent medical condition, however there is always the potential for conditions to change. Please read and follow the below instructions.  Please return to the Emergency Department immediately for any new or worsening symptoms. Please be sure to follow up with your Primary Care Provider within one week regarding your visit today; please call their office to schedule an appointment even if you are feeling better for a follow-up visit. Please take the medication Diamox as prescribed, your starting dose will be 250 mg twice daily for 7 days.  After 7 days your dose will increase to 500 mg twice daily.  Please follow-up with the neurologist Dr. Daisy Blossom on your discharge paperwork this week for reevaluation and further treatment. Additionally your hemoglobin level was 8.5 today, please discuss this with your primary care provider at your follow-up visit this week as repeat blood work may be needed.  Get help right away if: You have any of the following symptoms and they get worse or do not get better. Headaches. Nausea. Vomiting. Vision changes or difficulty seeing. You are very weak. You are short of breath. You have pain in your abdomen or chest. You are dizzy or feel faint. You have trouble concentrating. You have bloody or black, tarry stools. You vomit repeatedly or you vomit up blood. You have any new/concerning or worsening of symptoms  Please read the additional information packets attached to your discharge summary.  Do not take your medicine if  develop an itchy rash, swelling in your mouth or lips, or difficulty breathing; call 911 and seek immediate emergency medical attention if this occurs.  Note: Portions of this text may have been transcribed using voice recognition software. Every effort was made to ensure accuracy; however, inadvertent  computerized transcription errors may still be present.

## 2019-04-22 NOTE — ED Notes (Signed)
Discharge instructions reviewed with pt. Pt verbalized understanding.   

## 2019-04-22 NOTE — ED Provider Notes (Signed)
Medical screening examination/treatment/procedure(s) were conducted as a shared visit with non-physician practitioner(s) and myself.  I personally evaluated the patient during the encounter.  Patient presents to the ED with intermittent HA and color vision change. She was sent from her ophthalmologist with optic disc swelling on exam. This was confirmed on MRI and LP performed as below.   .Lumbar Puncture  Date/Time: 04/22/2019 12:02 AM Performed by: Maia Plan, MD Authorized by: Maia Plan, MD   Consent:    Consent obtained:  Written   Consent given by:  Patient   Risks discussed:  Bleeding, headache, nerve damage, infection, repeat procedure and pain Pre-procedure details:    Procedure purpose:  Diagnostic (and therapeutic)   Preparation: Patient was prepped and draped in usual sterile fashion   Anesthesia (see MAR for exact dosages):    Anesthesia method:  Local infiltration   Local anesthetic:  Lidocaine 1% w/o epi Procedure details:    Lumbar space:  L3-L4 interspace   Patient position:  L lateral decubitus   Needle gauge:  18   Needle type:  Diamond point   Needle length (in):  5.0   Ultrasound guidance: no     Number of attempts:  1   Opening pressure (cm H2O):  41   Closing pressure (cm H2O):  17   Fluid appearance:  Clear   Tubes of fluid:  4 Post-procedure:    Puncture site:  Adhesive bandage applied   Patient tolerance of procedure:  Tolerated well, no immediate complications     Alona Bene, MD Emergency Medicine    Deacon Gadbois, Arlyss Repress, MD 04/22/19 1242

## 2019-04-25 LAB — CSF CULTURE W GRAM STAIN: Culture: NO GROWTH

## 2019-04-27 ENCOUNTER — Other Ambulatory Visit: Payer: Self-pay

## 2019-04-27 ENCOUNTER — Ambulatory Visit (INDEPENDENT_AMBULATORY_CARE_PROVIDER_SITE_OTHER): Admitting: Diagnostic Neuroimaging

## 2019-04-27 ENCOUNTER — Encounter: Payer: Self-pay | Admitting: Diagnostic Neuroimaging

## 2019-04-27 VITALS — BP 135/77 | HR 80 | Temp 97.8°F | Ht 67.0 in | Wt 300.4 lb

## 2019-04-27 DIAGNOSIS — G932 Benign intracranial hypertension: Secondary | ICD-10-CM | POA: Diagnosis not present

## 2019-04-27 MED ORDER — ACETAZOLAMIDE 250 MG PO TABS: 500.0000 mg | ORAL_TABLET | Freq: Two times a day (BID) | ORAL | 12 refills | Status: DC

## 2019-04-27 NOTE — Patient Instructions (Signed)
idiopathic intracranial hypertension (pseudotumor cerebri); may be related to PCOS and weight  - continue acetazolamide 500mg  twice a day  - follow up with ophthalmology ([facility])

## 2019-04-27 NOTE — Progress Notes (Signed)
[facility]  PATIENT: Katie Benjamin DOB: [birth year]  REFERRING CLINICIAN: Rebecka Apley, NP HISTORY FROM: patient  REASON FOR VISIT: new consult    HISTORICAL  CHIEF COMPLAINT:  Chief Complaint  Patient presents with  . Increased CSF pressure    rm 6 New Pt "discovered during regular eye exam, went to ED-  LP done on 04/21/19"    HISTORY OF PRESENT ILLNESS:   39 year old female here for evaluation of idiopathic intracranial hypertension (pseudotumor cerebri).  Since 2005 patient has had intermittent blurred vision, headaches, heartbeat sensation in ear, transient visual obscurations.  Symptoms only last a few minutes at a time and go away.  Sometimes her headaches would increase when she would have weight gain and improve wench her weight would reduce.  Patient does have diagnosis of polycystic ovarian syndrome.  In last few months patient has had increasing left frontal and retro-orbital headaches and pain.  Also increasing blurred vision and tinnitus sensation.  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  Patient referred to ER for evaluation.  Patient had MRI of the brain and orbits which were unremarkable and then LP which showed elevated opening pressure 41 cm water.  Patient started on acetazolamide 250 mg twice a day and doing well.  Some numbness and tingling in hands and feet.  Headache and blurred vision have improved.    REVIEW OF SYSTEMS: Full 14 system review of systems performed and negative with exception of: As per HPI.  ALLERGIES: No Known Allergies  HOME MEDICATIONS: Outpatient Medications Prior to Visit  Medication Sig Dispense Refill  . acetaminophen (TYLENOL) 500 MG tablet Take 500 mg by mouth every 6 (six) hours as needed. 04/27/19 alt w/ibuprofen    . Ascorbic Acid (VITAMIN C) 1000 MG tablet Take 1,000 mg by mouth daily.     . Cholecalciferol (VITAMIN D3) 50 MCG (2000 UT) TABS Take  2,000 Units by mouth daily.    Marland Kitchen escitalopram (LEXAPRO) 20 MG tablet Take 20 mg by mouth at bedtime.    Marland Kitchen ibuprofen (ADVIL) 200 MG tablet Take 600-800 mg by mouth See admin instructions. Take 800 mg by mouth in the morning with breakfast, 600 mg with lunch, and 600 mg with supper (evening meal)    . Multiple Vitamins-Minerals (ONE-A-DAY WOMENS PO) Take 1 tablet by mouth daily with breakfast.    . acetaZOLAMIDE (DIAMOX) 250 MG tablet Take 1 tablet (250 mg total) by mouth 2 (two) times daily for 7 days, THEN 2 tablets (500 mg total) 2 (two) times daily for 10 days. 54 tablet 0  . ibuprofen (ADVIL,MOTRIN) 800 MG tablet Take 1 tablet (800 mg total) by mouth 3 (three) times daily. 30 tablet 0  . chlorpheniramine-HYDROcodone (TUSSIONEX PENNKINETIC ER) 10-8 MG/5ML SUER Take 5 mLs by mouth every 12 (twelve) hours as needed for cough. (Patient not taking: Reported on 04/21/2019) 120 mL 0  . doxycycline (VIBRAMYCIN) 100 MG capsule Take 1 capsule (100 mg total) by mouth 2 (two) times daily. X 7 days (Patient not taking: Reported on 04/21/2019) 14 capsule 0  . escitalopram (LEXAPRO) 10 MG tablet Take 1.5 tablets (15 mg total) by mouth daily. (Patient not taking: Reported on 04/21/2019) 45 tablet 1  . ondansetron (ZOFRAN ODT) 8 MG disintegrating tablet Take 1 tablet (8 mg total) by mouth every 8 (eight) hours as needed for nausea. (Patient not taking: Reported on 04/21/2019) 20 tablet 0  . pseudoephedrine-acetaminophen (TYLENOL SINUS) 30-500 MG TABS tablet  Take 1 tablet by mouth daily as needed (for sinus symptoms).     . topiramate (TOPAMAX) 50 MG tablet Take 1 tablet (50 mg total) by mouth daily. (Patient not taking: Reported on 04/21/2019) 30 tablet 1   No facility-administered medications prior to visit.    PAST MEDICAL HISTORY: Past Medical History:  Diagnosis Date  . Anemia 04/2019  . Anxiety   . Depression   . PCOS (polycystic ovarian syndrome)     PAST SURGICAL HISTORY: Past Surgical History:    Procedure Laterality Date  . CESAREAN SECTION      FAMILY HISTORY: Family History  Problem Relation Age of Onset  . Hypertension Mother   . Diabetes Mother   . Heart attack Father   . Hypertension Father   . Hyperlipidemia Father   . Alcohol abuse Father   . Menstrual problems Sister   . Alcohol abuse Sister   . Drug abuse Sister   . Obesity Sister   . Obesity Sister   . Depression Sister     SOCIAL HISTORY: Social History   Socioeconomic History  . Marital status: Married    Spouse name: Not on file  . Number of children: 1  . Years of education: Not on file  . Highest education level: Associate degree: academic program  Occupational History    Comment: Print production planner  Tobacco Use  . Smoking status: Never Smoker  . Smokeless tobacco: Never Used  Substance and Sexual Activity  . Alcohol use: No    Alcohol/week: 0.0 standard drinks  . Drug use: No  . Sexual activity: Yes    Birth control/protection: None  Other Topics Concern  . Not on file  Social History Narrative   Lives with family   Caffeine- coffee 1 1/2 c daily, none now   Social Determinants of Health   Financial Resource Strain:   . Difficulty of Paying Living Expenses: Not on file  Food Insecurity:   . Worried About Programme researcher, broadcasting/film/video in the Last Year: Not on file  . Ran Out of Food in the Last Year: Not on file  Transportation Needs:   . Lack of Transportation (Medical): Not on file  . Lack of Transportation (Non-Medical): Not on file  Physical Activity:   . Days of Exercise per Week: Not on file  . Minutes of Exercise per Session: Not on file  Stress:   . Feeling of Stress : Not on file  Social Connections:   . Frequency of Communication with Friends and Family: Not on file  . Frequency of Social Gatherings with Friends and Family: Not on file  . Attends Religious Services: Not on file  . Active Member of Clubs or Organizations: Not on file  . Attends Banker Meetings: Not  on file  . Marital Status: Not on file  Intimate Partner Violence:   . Fear of Current or Ex-Partner: Not on file  . Emotionally Abused: Not on file  . Physically Abused: Not on file  . Sexually Abused: Not on file     PHYSICAL EXAM  GENERAL EXAM/CONSTITUTIONAL: Vitals:  Vitals:   04/27/19 1416  BP: 135/77  Pulse: 80  Temp: 97.8 F (36.6 C)  Weight: (!) 300 lb 6.4 oz (136.3 kg)  Height: 5\' 7"  (1.702 m)   Body mass index is 47.05 kg/m. Wt Readings from Last 3 Encounters:  04/27/19 (!) 300 lb 6.4 oz (136.3 kg)  06/30/11 270 lb (122.5 kg)    Patient is  in no distress; well developed, nourished and groomed; neck is supple  CARDIOVASCULAR:  Examination of carotid arteries is normal; no carotid bruits  Regular rate and rhythm, no murmurs  Examination of peripheral vascular system by observation and palpation is normal  EYES:    Hearing Screening   125Hz  250Hz  500Hz  1000Hz  2000Hz  3000Hz  4000Hz  6000Hz  8000Hz   Right ear:           Left ear:             Visual Acuity Screening   Right eye Left eye Both eyes  Without correction:     With correction: 20/30 20/30     MUSCULOSKELETAL:  Gait, strength, tone, movements noted in Neurologic exam below  NEUROLOGIC: MENTAL STATUS:  No flowsheet data found.  awake, alert, oriented to person, place and time  recent and remote memory intact  normal attention and concentration  language fluent, comprehension intact, naming intact  fund of knowledge appropriate  CRANIAL NERVE:   2nd - SLIGHTLY BLURRED OPTIC [place]   2nd, 3rd, 4th, 6th - pupils equal and reactive to light, visual fields full to confrontation, extraocular muscles intact, no nystagmus  5th - facial sensation symmetric  7th - facial strength symmetric  8th - hearing intact  9th - palate elevates symmetrically, uvula midline  11th - shoulder shrug symmetric  12th - tongue protrusion midline  MOTOR:   normal bulk and tone, full  strength in the BUE, BLE  SENSORY:   normal and symmetric to light touch, temperature, vibration  COORDINATION:   finger-nose-finger, fine finger movements normal  REFLEXES:   deep tendon reflexes present and symmetric  GAIT/STATION:   narrow based gait     DIAGNOSTIC DATA (LABS, IMAGING, TESTING) - I reviewed patient records, labs, notes, testing and imaging myself where available.  Lab Results  Component Value Date   WBC 8.3 04/21/2019   HGB 8.5 (L) 04/21/2019   HCT 30.0 (L) 04/21/2019   MCV 77.5 (L) 04/21/2019   PLT 459 (H) 04/21/2019      Component Value Date/Time   NA 142 04/21/2019 1759   K 3.9 04/21/2019 1759   CL 109 04/21/2019 1759   CO2 24 04/21/2019 1759   GLUCOSE 100 (H) 04/21/2019 1759   BUN 12 04/21/2019 1759   CREATININE 0.71 04/21/2019 1759   CALCIUM 8.8 (L) 04/21/2019 1759   PROT 6.9 06/30/2011 1233   ALBUMIN 3.7 06/30/2011 1233   AST 20 06/30/2011 1233   ALT 31 06/30/2011 1233   ALKPHOS 91 06/30/2011 1233   BILITOT 0.3 06/30/2011 1233   GFRNONAA >60 04/21/2019 1759   GFRAA >60 04/21/2019 1759   No results found for: CHOL, HDL, LDLCALC, LDLDIRECT, TRIG, CHOLHDL No results found for: HGBA1C No results found for: VITAMINB12 No results found for: TSH   04/21/19 MRI brain and orbits [I reviewed images myself and agree with interpretation. -VRP]  1. Empty sella with increased CSF signal intensity along the optic nerve sheaths and suggestion of slight bulging of the optic nerve discs at the posterior aspect of the globes. Findings suggestive of increased intracranial pressure, and can be seen in the setting of idiopathic intracranial hypertension (pseudotumor cerebri). Correlation with LP and opening pressure suggested for correlated purposes. 2. Otherwise unremarkable and normal MRI of the brain and orbits.    ASSESSMENT AND PLAN  39 y.o. year old female here with:  Dx:  1. IIH (idiopathic intracranial hypertension)      PLAN:  idiopathic intracranial hypertension (  pseudotumor cerebri); may be related to PCOS and weight  - continue acetazolamide 500mg  twice a day  - follow up with ophthalmology ([facility])  Meds ordered this encounter  Medications  . DISCONTD: acetaZOLAMIDE (DIAMOX) 250 MG tablet    Sig: Take 2 tablets (500 mg total) by mouth 2 (two) times daily.    Dispense:  120 tablet    Refill:  12  . acetaZOLAMIDE (DIAMOX) 250 MG tablet    Sig: Take 2 tablets (500 mg total) by mouth 2 (two) times daily.    Dispense:  120 tablet    Refill:  12   Return in about 6 months (around 10/25/2019) for with NP (Amy Lomax).    10/27/2019, MD 04/27/2019, 3:43 PM Certified in Neurology, Neurophysiology and Neuroimaging  [facility] [address], [address] [place], [place] [id number]

## 2019-06-07 ENCOUNTER — Other Ambulatory Visit: Payer: Self-pay | Admitting: *Deleted

## 2019-06-07 MED ORDER — ACETAZOLAMIDE 250 MG PO TABS: 500.0000 mg | ORAL_TABLET | Freq: Two times a day (BID) | ORAL | 11 refills | Status: DC

## 2019-10-25 ENCOUNTER — Ambulatory Visit (INDEPENDENT_AMBULATORY_CARE_PROVIDER_SITE_OTHER): Admitting: Family Medicine

## 2019-10-25 ENCOUNTER — Other Ambulatory Visit: Payer: Self-pay

## 2019-10-25 VITALS — BP 145/73 | HR 48 | Ht 67.0 in | Wt 279.0 lb

## 2019-10-25 DIAGNOSIS — G932 Benign intracranial hypertension: Secondary | ICD-10-CM | POA: Diagnosis not present

## 2019-10-25 NOTE — Progress Notes (Signed)
PATIENT: Katie Benjamin DOB: November 06, 1980  REASON FOR VISIT: follow up HISTORY FROM: patient  Chief Complaint  Patient presents with  . Follow-up    rm 2 here for a f/u on IIH     HISTORY OF PRESENT ILLNESS: Today 10/26/19 Katie Benjamin is a 39 y.o. female here today for follow up for IIH. She is doing well. She is tolerating Diamox 500mg  twice daily. She is working on healthy lifestyle changes and has lost about 30 pounds. She rpeorts headaches have resolved. She has had follow up with ophthalmology who reports edema is improved. No new vision changes.   HISTORY: (copied from Dr note on 04/27/2019)  39 year old female here for evaluation of idiopathic intracranial hypertension (pseudotumor cerebri).  Since 2005 patient has had intermittent blurred vision, headaches, heartbeat sensation in ear, transient visual obscurations.  Symptoms only last a few minutes at a time and go away.  Sometimes her headaches would increase when she would have weight gain and improve wench her weight would reduce.  Patient does have diagnosis of polycystic ovarian syndrome.  In last few months patient has had increasing left frontal and retro-orbital headaches and pain.  Also increasing blurred vision and tinnitus sensation.  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  Patient referred to ER for evaluation.  Patient had MRI of the brain and orbits which were unremarkable and then LP which showed elevated opening pressure 41 cm water.  Patient started on acetazolamide 250 mg twice a day and doing well.  Some numbness and tingling in hands and feet.  Headache and blurred vision have improved.   REVIEW OF SYSTEMS: Out of a complete 14 system review of symptoms, the patient complains only of the following symptoms, headaches and all other reviewed systems are negative.  ALLERGIES: No Known Allergies  HOME MEDICATIONS: Outpatient Medications Prior  to Visit  Medication Sig Dispense Refill  . acetaminophen (TYLENOL) 500 MG tablet Take 500 mg by mouth every 6 (six) hours as needed. 04/27/19 alt w/ibuprofen    . acetaZOLAMIDE (DIAMOX) 250 MG tablet Take 2 tablets (500 mg total) by mouth 2 (two) times daily. 120 tablet 11  . Ascorbic Acid (VITAMIN C) 1000 MG tablet Take 1,000 mg by mouth daily.     . Cholecalciferol (VITAMIN D3) 50 MCG (2000 UT) TABS Take 2,000 Units by mouth daily.    04/29/19 ibuprofen (ADVIL) 200 MG tablet Take 600-800 mg by mouth See admin instructions. Take 800 mg by mouth in the morning with breakfast, 600 mg with lunch, and 600 mg with supper (evening meal)    . Multiple Vitamins-Minerals (ONE-A-DAY WOMENS PO) Take 1 tablet by mouth daily with breakfast.    . oral electrolyte (EQUALYTE) solution Take by mouth as needed.    . ORAL ELECTROLYTES PO Take by mouth.    . escitalopram (LEXAPRO) 20 MG tablet Take 20 mg by mouth at bedtime.     No facility-administered medications prior to visit.    PAST MEDICAL HISTORY: Past Medical History:  Diagnosis Date  . Anemia 04/2019  . Anxiety   . Depression   . PCOS (polycystic ovarian syndrome)     PAST SURGICAL HISTORY: Past Surgical History:  Procedure Laterality Date  . CESAREAN SECTION      FAMILY HISTORY: Family History  Problem Relation Age of Onset  . Hypertension Mother   . Diabetes Mother   . Heart attack Father   . Hypertension Father   .  Hyperlipidemia Father   . Alcohol abuse Father   . Menstrual problems Sister   . Alcohol abuse Sister   . Drug abuse Sister   . Obesity Sister   . Obesity Sister   . Depression Sister     SOCIAL HISTORY: Social History   Socioeconomic History  . Marital status: Married    Spouse name: Not on file  . Number of children: 1  . Years of education: Not on file  . Highest education level: Associate degree: academic program  Occupational History    Comment: Print production planner  Tobacco Use  . Smoking status: Never Smoker    . Smokeless tobacco: Never Used  Substance and Sexual Activity  . Alcohol use: No    Alcohol/week: 0.0 standard drinks  . Drug use: No  . Sexual activity: Yes    Birth control/protection: None  Other Topics Concern  . Not on file  Social History Narrative   Lives with family   Caffeine- coffee 1 1/2 c daily, none now   Social Determinants of Health   Financial Resource Strain:   . Difficulty of Paying Living Expenses:   Food Insecurity:   . Worried About Programme researcher, broadcasting/film/video in the Last Year:   . Barista in the Last Year:   Transportation Needs:   . Freight forwarder (Medical):   Marland Kitchen Lack of Transportation (Non-Medical):   Physical Activity:   . Days of Exercise per Week:   . Minutes of Exercise per Session:   Stress:   . Feeling of Stress :   Social Connections:   . Frequency of Communication with Friends and Family:   . Frequency of Social Gatherings with Friends and Family:   . Attends Religious Services:   . Active Member of Clubs or Organizations:   . Attends Banker Meetings:   Marland Kitchen Marital Status:   Intimate Partner Violence:   . Fear of Current or Ex-Partner:   . Emotionally Abused:   Marland Kitchen Physically Abused:   . Sexually Abused:       PHYSICAL EXAM  Vitals:   10/25/19 1529 10/25/19 1532  BP: (!) 152/72 (!) 145/73  Pulse: (!) 49 (!) 48  Weight: 279 lb (126.6 kg)   Height: 5\' 7"  (1.702 m)    Body mass index is 43.7 kg/m.  Generalized: Well developed, in no acute distress  Cardiology: normal rate and rhythm, no murmur noted Respiratory: clear to auscultation bilaterally  Neurological examination  Mentation: Alert oriented to time, place, history taking. Follows all commands speech and language fluent Cranial nerve II-XII: Pupils were equal round reactive to light. Extraocular movements were full, visual field were full  Motor: The motor testing reveals 5 over 5 strength of all 4 extremities. Good symmetric motor tone is noted  throughout.  Gait and station: Gait is normal.   DIAGNOSTIC DATA (LABS, IMAGING, TESTING) - I reviewed patient records, labs, notes, testing and imaging myself where available.  No flowsheet data found.   Lab Results  Component Value Date   WBC 8.3 04/21/2019   HGB 8.5 (L) 04/21/2019   HCT 30.0 (L) 04/21/2019   MCV 77.5 (L) 04/21/2019   PLT 459 (H) 04/21/2019      Component Value Date/Time   NA 142 04/21/2019 1759   K 3.9 04/21/2019 1759   CL 109 04/21/2019 1759   CO2 24 04/21/2019 1759   GLUCOSE 100 (H) 04/21/2019 1759   BUN 12 04/21/2019 1759   CREATININE  0.71 04/21/2019 1759   CALCIUM 8.8 (L) 04/21/2019 1759   PROT 6.9 06/30/2011 1233   ALBUMIN 3.7 06/30/2011 1233   AST 20 06/30/2011 1233   ALT 31 06/30/2011 1233   ALKPHOS 91 06/30/2011 1233   BILITOT 0.3 06/30/2011 1233   GFRNONAA >60 04/21/2019 1759   GFRAA >60 04/21/2019 1759   No results found for: CHOL, HDL, LDLCALC, LDLDIRECT, TRIG, CHOLHDL No results found for: SJGG8Z No results found for: VITAMINB12 No results found for: TSH     ASSESSMENT AND PLAN 39 y.o. year old female  has a past medical history of Anemia (04/2019), Anxiety, Depression, and PCOS (polycystic ovarian syndrome). here with     ICD-10-CM   1. IIH (idiopathic intracranial hypertension)  G93.2     Alia is doing well. She will continue Diamox 500mg  twice daily. She was encouraged to continue healthy lifestyle habits. Continue regular follow up with ophthalmology. She will return to see me in 1 year, sooner if needed. She verbalizes understanding and agreement with this plan.    No orders of the defined types were placed in this encounter.    No orders of the defined types were placed in this encounter.     I spent 15 minutes with the patient. 50% of this time was spent counseling and educating patient on plan of care and medications.    , FNP-C 10/26/2019, 4:36 PM [facility] [address], [address] [place], [place] [phone]

## 2019-10-25 NOTE — Patient Instructions (Signed)
We will continue Diamox 500mg  twice daily. Stay well hydrated. Continue healthy lifestyle lifestyle habits.   Continue regular follow up with opthalmology   Follow up in 1 year, sooner if needed    Acetazolamide Injection What is this medicine? ACETAZOLAMIDE (a set a ZOLE a mide) is a diuretic. It helps you make more urine and to lose salt and excess water from your body. It treats swelling from heart disease. It also helps treat some seizures and glaucoma. This medicine may be used for other purposes; ask your health care provider or pharmacist if you have questions. COMMON BRAND NAME(S): Diamox What should I tell my health care provider before I take this medicine? They need to know if you have any of these conditions:  glaucoma  kidney disease  liver disease  low adrenal gland function  lung or breathing disease (COPD, chronic bronchitis, emphysema)  an unusual or allergic reaction to acetazolamide, sulfa drugs, other drugs, foods, dyes or preservatives  pregnant or trying to get pregnant  breast-feeding How should I use this medicine? This drug is injected into a vein. It is given by a health care provider in a hospital or clinic setting. Talk to your health care provider about the use of this drug in children. Special care may be needed. Overdosage: If you think you have taken too much of this medicine contact a poison control center or emergency room at once. NOTE: This medicine is only for you. Do not share this medicine with others. What if I miss a dose? This does not apply. This drug is not for regular use. What may interact with this medicine? Do not take this medicine with any of the following medications:  methazolamide This medicine may also interact with the following medications:  aspirin and aspirin-like medicines  cyclosporine  lithium  medicine for diabetes  methenamine  other diuretics  phenytoin  primidone  quinidine  sodium  bicarbonate  stimulant medicines like dextroamphetamine This list may not describe all possible interactions. Give your health care provider a list of all the medicines, herbs, non-prescription drugs, or dietary supplements you use. Also tell them if you smoke, drink alcohol, or use illegal drugs. Some items may interact with your medicine. What should I watch for while using this medicine? Your condition will be monitored carefully while you are receiving this drug. This drug may cause serious skin reactions. They can happen weeks to months after starting the drug. Contact your health care provider right away if you notice fevers or flu-like symptoms with a rash. The rash may be red or purple and then turn into blisters or peeling of the skin. Or, you might notice a red rash with swelling of the face, lips or lymph nodes in your neck or under your arms. You may get drowsy or dizzy. Do not drive, use machinery, or do anything that needs mental alertness until you know how this drug affects you. Do not stand up or sit up quickly, especially if you are an older patient. This reduces the risk of dizzy or fainting spells. What side effects may I notice from receiving this medicine? Side effects that you should report to your doctor or health care provider as soon as possible:  allergic reactions (skin rash, itching or hives; swelling of the face, lips, or tongue)  high acid levels (trouble breathing; fast, irregular heartbeat; headache; confusion; unusually weak or tired; nausea, vomiting)  infection (fever, chills, cough, sore throat, pain or trouble passing urine)  liver injury (dark yellow or brown urine; general ill feeling or flu-like symptoms; loss of appetite, right upper belly pain; unusually weak or tired, yellowing of the eyes or skin)  low red blood cell counts (trouble breathing; feeling faint; lightheaded, falls; unusually weak or tired)  redness, blistering, peeling, or loosening of  the skin, including inside the mouth  unusual bruising or bleeding Side effects that usually do not require medical attention (report to your doctor or health care provider if they continue or are bothersome):  decreased hearing, ringing of the ears  diarrhea  increased thirst  kidney stones (blood in the urine; pain when urinating; pain the lower back or side)  loss of appetite  nausea  pain, tingling, numbness in the hands or feet  unusual sweating  vomiting This list may not describe all possible side effects. Call your doctor for medical advice about side effects. You may report side effects to FDA at 1-800-FDA-1088. Where should I keep my medicine? This drug is given in a hospital or clinic. It will not be stored at home. NOTE: This sheet is a summary. It may not cover all possible information. If you have questions about this medicine, talk to your doctor, pharmacist, or health care provider.  2020 Elsevier/Gold Standard (2019-01-21 12:08:58)   Idiopathic Intracranial Hypertension  Idiopathic intracranial hypertension (IIH) is a condition that increases pressure around the brain. The fluid that surrounds the brain and spinal cord (cerebrospinal fluid, CSF) increases and causes the pressure. Idiopathic means that the cause of this condition is not known. IIH affects the brain and spinal cord (is a neurological disorder). If this condition is not treated, it can cause vision loss or blindness. What increases the risk? You are more likely to develop this condition if:  You are severely overweight (obese).  You are a woman who has not gone through menopause.  You take certain medicines, such as birth control or steroids. What are the signs or symptoms? Symptoms of IIH include:  Headaches. This is the most common symptom.  Pain in the shoulders or neck.  Nausea and vomiting.  A "rushing water" or pulsing sound within the ears (pulsatile tinnitus).  Double  vision.  Blurred vision.  Brief episodes of complete vision loss. How is this diagnosed? This condition may be diagnosed based on:  Your symptoms.  Your medical history.  CT scan of the brain.  MRI of the brain.  Magnetic resonance venogram (MRV) to check veins in the brain.  Diagnostic lumbar puncture. This is a procedure to remove and examine a sample of cerebrospinal fluid. This procedure can determine whether too much fluid may be causing IIH.  A thorough eye exam to check for swelling or nerve damage in the eyes. How is this treated? Treatment for this condition depends on your symptoms. The goal of treatment is to decrease the pressure around your brain. Common treatments include:  Medicines to decrease the production of spinal fluid and lower the pressure within your skull.  Medicines to prevent or treat headaches.  Surgery to place drains (shunts) in your brain to remove excess fluid.  Lumbar puncture to remove excess cerebrospinal fluid. Follow these instructions at home:  If you are overweight or obese, work with your health care provider to lose weight.  Take over-the-counter and prescription medicines only as told by your health care provider.  Do not drive or use heavy machinery while taking medicines that can make you sleepy.  Keep all follow-up visits as told  by your health care provider. This is important. Contact a health care provider if:  You have changes in your vision, such as: ? Double vision. ? Not being able to see colors (color vision). Get help right away if:  You have any of the following symptoms and they get worse or do not get better. ? Headaches. ? Nausea. ? Vomiting. ? Vision changes or difficulty seeing. Summary  Idiopathic intracranial hypertension (IIH) is a condition that increases pressure around the brain. The cause is not known (is idiopathic).  The most common symptom of IIH is headaches.  Treatment may include  medicines or surgery to relieve the pressure on your brain. This information is not intended to replace advice given to you by your health care provider. Make sure you discuss any questions you have with your health care provider. Document Revised: 03/07/2017 Document Reviewed: 02/14/2016 Elsevier Patient Education  2020 [employer].

## 2019-10-26 ENCOUNTER — Encounter: Payer: Self-pay | Admitting: Family Medicine

## 2019-11-10 ENCOUNTER — Encounter: Payer: Self-pay | Admitting: Family Medicine

## 2019-11-12 NOTE — Progress Notes (Signed)
I reviewed note and agree with plan.   Cabria Micalizzi R. Aleksis Jiggetts, MD 11/12/2019, 12:34 PM Certified in Neurology, Neurophysiology and Neuroimaging  [facility] [address], [address] [place], [place] [address] [phone]  

## 2020-01-30 ENCOUNTER — Encounter (HOSPITAL_COMMUNITY): Payer: Self-pay

## 2020-01-30 ENCOUNTER — Ambulatory Visit (HOSPITAL_COMMUNITY)
Admission: EM | Admit: 2020-01-30 | Discharge: 2020-01-30 | Disposition: A | Discharge status: Home / Self Care | Attending: Family Medicine | Admitting: Family Medicine

## 2020-01-30 ENCOUNTER — Other Ambulatory Visit: Payer: Self-pay

## 2020-01-30 DIAGNOSIS — J069 Acute upper respiratory infection, unspecified: Secondary | ICD-10-CM | POA: Insufficient documentation

## 2020-01-30 DIAGNOSIS — J029 Acute pharyngitis, unspecified: Secondary | ICD-10-CM | POA: Diagnosis present

## 2020-01-30 DIAGNOSIS — R52 Pain, unspecified: Secondary | ICD-10-CM | POA: Diagnosis present

## 2020-01-30 DIAGNOSIS — Z1152 Encounter for screening for COVID-19: Secondary | ICD-10-CM | POA: Diagnosis not present

## 2020-01-30 NOTE — Discharge Instructions (Addendum)

## 2020-01-30 NOTE — ED Triage Notes (Signed)
Pt present coughing with body aches and sore throat. Symptoms started on Saturday. Pt states her daughter got sick on Thursday and pt stayed home to take care of her and now she is sick.

## 2020-01-30 NOTE — ED Provider Notes (Signed)
[facility]   MRN: [id number] DOB: 1981/03/08  Subjective:   Katie Benjamin is a 39 y.o. female presenting for 1 day hx of acute onset body aches, throat pain. Her daughter is also ill with similar sx, has an appt for tomorrow. COVID vaccination was at the end of July 2021.   No current facility-administered medications for this encounter.  Current Outpatient Medications:  .  acetaminophen (TYLENOL) 500 MG tablet, Take 500 mg by mouth every 6 (six) hours as needed. 04/27/19 alt w/ibuprofen, Disp: , Rfl:  .  acetaZOLAMIDE (DIAMOX) 250 MG tablet, Take 2 tablets (500 mg total) by mouth 2 (two) times daily., Disp: 120 tablet, Rfl: 11 .  Ascorbic Acid (VITAMIN C) 1000 MG tablet, Take 1,000 mg by mouth daily. , Disp: , Rfl:  .  Cholecalciferol (VITAMIN D3) 50 MCG (2000 UT) TABS, Take 2,000 Units by mouth daily., Disp: , Rfl:  .  ibuprofen (ADVIL) 200 MG tablet, Take 600-800 mg by mouth See admin instructions. Take 800 mg by mouth in the morning with breakfast, 600 mg with lunch, and 600 mg with supper (evening meal), Disp: , Rfl:  .  Multiple Vitamins-Minerals (ONE-A-DAY WOMENS PO), Take 1 tablet by mouth daily with breakfast., Disp: , Rfl:  .  oral electrolyte (EQUALYTE) solution, Take by mouth as needed., Disp: , Rfl:  .  ORAL ELECTROLYTES PO, Take by mouth., Disp: , Rfl:    No Known Allergies  Past Medical History:  Diagnosis Date  . Anemia 04/2019  . Anxiety   . Depression   . PCOS (polycystic ovarian syndrome)      Past Surgical History:  Procedure Laterality Date  . CESAREAN SECTION      Family History  Problem Relation Age of Onset  . Hypertension Mother   . Diabetes Mother   . Heart attack Father   . Hypertension Father   . Hyperlipidemia Father   . Alcohol abuse Father   . Menstrual problems Sister   . Alcohol abuse Sister   . Drug abuse Sister   . Obesity Sister   . Obesity Sister   . Depression Sister     Social History   Tobacco Use    . Smoking status: Never Smoker  . Smokeless tobacco: Never Used  Substance Use Topics  . Alcohol use: No    Alcohol/week: 0.0 standard drinks  . Drug use: No    ROS   Objective:   Vitals: BP (!) 145/73 (BP Location: Right Arm)   Pulse 64   Temp 98 F (36.7 C) (Oral)   Resp 18   SpO2 100%   Physical Exam Constitutional:      General: She is not in acute distress.    Appearance: Normal appearance. She is well-developed. She is not ill-appearing, toxic-appearing or diaphoretic.  HENT:     Head: Normocephalic and atraumatic.     Nose: Nose normal.     Mouth/Throat:     Mouth: Mucous membranes are moist.     Pharynx: No pharyngeal swelling, oropharyngeal exudate, posterior oropharyngeal erythema or uvula swelling.     Comments: Significant post-nasal drainage overlying pharynx.  Eyes:     Extraocular Movements: Extraocular movements intact.     Pupils: Pupils are equal, round, and reactive to light.  Cardiovascular:     Rate and Rhythm: Normal rate and regular rhythm.     Pulses: Normal pulses.     Heart sounds: Normal heart sounds. No murmur heard.  No friction rub. No gallop.   Pulmonary:     Effort: Pulmonary effort is normal. No respiratory distress.     Breath sounds: Normal breath sounds. No stridor. No wheezing, rhonchi or rales.  Skin:    General: Skin is warm and dry.     Findings: No rash.  Neurological:     Mental Status: She is alert and oriented to person, place, and time.  Psychiatric:        Mood and Affect: Mood normal.        Behavior: Behavior normal.        Thought Content: Thought content normal.       Assessment and Plan :   PDMP not reviewed this encounter.  1. Encounter for screening for COVID-19   2. Viral upper respiratory tract infection   3. Sore throat   4. Body aches     Will manage for viral illness such as viral URI, viral syndrome, viral pharyngitis, COVID-19. Counseled patient on nature of COVID-19 including modes of  transmission, diagnostic testing, management and supportive care.  Offered scripts for symptomatic relief. COVID 19 testing is pending. Counseled patient on potential for adverse effects with medications prescribed/recommended today, ER and return-to-clinic precautions discussed, patient verbalized understanding.     Wallis Bamberg, [place] 01/31/20 [phone]

## 2020-01-31 LAB — SARS CORONAVIRUS 2 (TAT 6-24 HRS): SARS Coronavirus 2: NEGATIVE

## 2020-05-15 ENCOUNTER — Other Ambulatory Visit: Payer: Self-pay | Admitting: Diagnostic Neuroimaging

## 2020-05-16 ENCOUNTER — Telehealth: Payer: Self-pay | Admitting: Family Medicine

## 2020-05-16 NOTE — Telephone Encounter (Signed)
Reviewed report from Dr Dione Booze. Trace disc edema OU, stable from previous visits. She will continue Diamox 500mg  BID.

## 2020-10-25 ENCOUNTER — Encounter: Payer: Self-pay | Admitting: Family Medicine

## 2020-10-25 ENCOUNTER — Ambulatory Visit (INDEPENDENT_AMBULATORY_CARE_PROVIDER_SITE_OTHER): Admitting: Family Medicine

## 2020-10-25 VITALS — BP 139/93 | HR 67 | Ht 67.0 in | Wt 253.8 lb

## 2020-10-25 DIAGNOSIS — G932 Benign intracranial hypertension: Secondary | ICD-10-CM

## 2020-10-25 MED ORDER — ACETAZOLAMIDE 250 MG PO TABS: 500.0000 mg | ORAL_TABLET | Freq: Two times a day (BID) | ORAL | 0 refills | Status: DC

## 2020-10-25 NOTE — Progress Notes (Signed)
PATIENT: Katie Benjamin DOB: April 16, 1980  REASON FOR VISIT: follow up HISTORY FROM: patient  Chief Complaint  Patient presents with   Follow-up    Corner rm, alone. Here for yearly f/u, pt reports doing well. Pt would like to discuss weaning off Diamox.      HISTORY OF PRESENT ILLNESS: 10/25/20 ALL:  Katie Benjamin returns for follow up for IIH. She is doing very well. She has continues Diamox 500mg  BID. Eye exams q56mo have been normal, no edema. She has started eating a Keto syle diet and has lost about 60 pounds. She would like to wean Diamox.   10/25/2019 ALL:  Katie Benjamin is a 40 y.o. female here today for follow up for IIH. She is doing well. She is tolerating Diamox 500mg  twice daily. She is working on healthy lifestyle changes and has lost about 30 pounds. She rpeorts headaches have resolved. She has had follow up with ophthalmology who reports edema is improved. No new vision changes.   HISTORY: (copied from Dr 24 note on 04/27/2019)  40 year old female here for evaluation of idiopathic intracranial hypertension (pseudotumor cerebri).   Since 2005 patient has had intermittent blurred vision, headaches, heartbeat sensation in ear, transient visual obscurations.  Symptoms only last a few minutes at a time and go away.  Sometimes her headaches would increase when she would have weight gain and improve wench her weight would reduce.  Patient does have diagnosis of polycystic ovarian syndrome.   In last few months patient has had increasing left frontal and retro-orbital headaches and pain.  Also increasing blurred vision and tinnitus sensation.  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  Patient referred to ER for evaluation.  Patient had MRI of the brain and orbits which were unremarkable and then LP which showed elevated opening pressure 41 cm water.  Patient started on acetazolamide 250 mg twice a day and doing well.  Some  numbness and tingling in hands and feet.  Headache and blurred vision have improved.   REVIEW OF SYSTEMS: Out of a complete 14 system review of symptoms, the patient complains only of the following symptoms, none and all other reviewed systems are negative.   ALLERGIES: No Known Allergies  HOME MEDICATIONS: Outpatient Medications Prior to Visit  Medication Sig Dispense Refill   acetaminophen (TYLENOL) 500 MG tablet Take 500 mg by mouth every 6 (six) hours as needed. 04/27/19 alt w/ibuprofen     Ascorbic Acid (VITAMIN C) 1000 MG tablet Take 1,000 mg by mouth daily.      Cholecalciferol (VITAMIN D3) 50 MCG (2000 UT) TABS Take 2,000 Units by mouth daily.     Multiple Vitamins-Minerals (ONE-A-DAY WOMENS PO) Take 1 tablet by mouth daily with breakfast.     ORAL ELECTROLYTES PO Take by mouth.     acetaZOLAMIDE (DIAMOX) 250 MG tablet TAKE 2 TABLETS TWICE A DAY 360 tablet 3   oral electrolyte (EQUALYTE) solution Take by mouth as needed.     ibuprofen (ADVIL) 200 MG tablet Take 600-800 mg by mouth See admin instructions. Take 800 mg by mouth in the morning with breakfast, 600 mg with lunch, and 600 mg with supper (evening meal)     No facility-administered medications prior to visit.    PAST MEDICAL HISTORY: Past Medical History:  Diagnosis Date   Anemia 04/2019   Anxiety    Depression    PCOS (polycystic ovarian syndrome)     PAST SURGICAL HISTORY: Past Surgical  History:  Procedure Laterality Date   CESAREAN SECTION      FAMILY HISTORY: Family History  Problem Relation Age of Onset   Hypertension Mother    Diabetes Mother    Heart attack Father    Hypertension Father    Hyperlipidemia Father    Alcohol abuse Father    Menstrual problems Sister    Alcohol abuse Sister    Drug abuse Sister    Obesity Sister    Obesity Sister    Depression Sister     SOCIAL HISTORY: Social History   Socioeconomic History   Marital status: Married    Spouse name: Not on file   Number  of children: 1   Years of education: Not on file   Highest education level: Associate degree: academic program  Occupational History    Comment: Print production planner  Tobacco Use   Smoking status: Never   Smokeless tobacco: Never  Substance and Sexual Activity   Alcohol use: No    Alcohol/week: 0.0 standard drinks   Drug use: No   Sexual activity: Yes    Birth control/protection: None  Other Topics Concern   Not on file  Social History Narrative   Lives with family   Caffeine- coffee 1 1/2 c daily, none now   Social Determinants of Corporate investment banker Strain: Not on file  Food Insecurity: Not on file  Transportation Needs: Not on file  Physical Activity: Not on file  Stress: Not on file  Social Connections: Not on file  Intimate Partner Violence: Not on file      PHYSICAL EXAM  Vitals:   10/25/20 1536  BP: (!) 139/93  Pulse: 67  Weight: 253 lb 12.8 oz (115.1 kg)  Height: 5\' 7"  (1.702 m)   Body mass index is 39.75 kg/m.  Generalized: Well developed, in no acute distress  Cardiology: normal rate and rhythm, no murmur noted Respiratory: clear to auscultation bilaterally  Neurological examination  Mentation: Alert oriented to time, place, history taking. Follows all commands speech and language fluent Cranial nerve II-XII: Pupils were equal round reactive to light. Extraocular movements were full, visual field were full  Motor: The motor testing reveals 5 over 5 strength of all 4 extremities. Good symmetric motor tone is noted throughout.  Gait and station: Gait is normal.   DIAGNOSTIC DATA (LABS, IMAGING, TESTING) - I reviewed patient records, labs, notes, testing and imaging myself where available.  No flowsheet data found.   Lab Results  Component Value Date   WBC 8.3 04/21/2019   HGB 8.5 (L) 04/21/2019   HCT 30.0 (L) 04/21/2019   MCV 77.5 (L) 04/21/2019   PLT 459 (H) 04/21/2019      Component Value Date/Time   NA 142 04/21/2019 1759   K 3.9  04/21/2019 1759   CL 109 04/21/2019 1759   CO2 24 04/21/2019 1759   GLUCOSE 100 (H) 04/21/2019 1759   BUN 12 04/21/2019 1759   CREATININE 0.71 04/21/2019 1759   CALCIUM 8.8 (L) 04/21/2019 1759   PROT 6.9 06/30/2011 1233   ALBUMIN 3.7 06/30/2011 1233   AST 20 06/30/2011 1233   ALT 31 06/30/2011 1233   ALKPHOS 91 06/30/2011 1233   BILITOT 0.3 06/30/2011 1233   GFRNONAA >60 04/21/2019 1759   GFRAA >60 04/21/2019 1759   No results found for: CHOL, HDL, LDLCALC, LDLDIRECT, TRIG, CHOLHDL No results found for: 04/23/2019 No results found for: VITAMINB12 No results found for: TSH     ASSESSMENT  AND PLAN 40 y.o. year old female  has a past medical history of Anemia (04/2019), Anxiety, Depression, and PCOS (polycystic ovarian syndrome). here with     ICD-10-CM   1. IIH (idiopathic intracranial hypertension)  G93.2        Katie Benjamin is doing well. She has worked on [employer] and lost 60 pounds. She wishes to wean Diamox. We have discussed risks and benefits. I have educated her on signs and symptoms of IIH. She will monitor closely. I will have her decrease dose to 500mg  in am and 250mg  in the evenings for 1-2 weeks, then decrease dose to 250mg  BID for 4-6 weeks, then decreased dose to 250mg  daily for 1 week, then discontinue. She verbalizes understanding of weaning instructions. Educational materials provided as well.  She was encouraged to continue healthy lifestyle habits. Continue regular follow up with ophthalmology. She will return to see me in 3-6 months, sooner if needed. She verbalizes understanding and agreement with this plan.    No orders of the defined types were placed in this encounter.    Meds ordered this encounter  Medications   acetaZOLAMIDE (DIAMOX) 250 MG tablet    Sig: Take 2 tablets (500 mg total) by mouth 2 (two) times daily.    Dispense:  60 tablet    Refill:  0    Order Specific Question:   Supervising Provider    Answer:           I spent 15 minutes with the patient. 50% of this time was spent counseling and educating patient on plan of care and medications.    , FNP-C 10/25/2020, 4:17 PM [facility] [address], [address] [place], Shawnie Dapper 10/27/2020 [phone]

## 2020-10-25 NOTE — Patient Instructions (Signed)
Below is our plan:  We will wean Diamox. Take 500mg  in the morning and 250mg  at bedtime for 1-2 weeks. If tolerated well, decrease Diamox to 250mg  twice daily for 4-6 weeks. If you continue to do well you can wean to 250 daily for 1 week then stop.   Please make sure you are staying well hydrated. I recommend 50-60 ounces daily. Well balanced diet and regular exercise encouraged. Consistent sleep schedule with 6-8 hours recommended.   Please continue follow up with care team as directed.   Follow up with me in 3-6 months, sooner if needed.   You may receive a survey regarding today's visit. I encourage you to leave honest feed back as I do use this information to improve patient care. Thank you for seeing me today!

## 2020-11-08 ENCOUNTER — Telehealth: Payer: Self-pay | Admitting: Family Medicine

## 2020-11-08 MED ORDER — ACETAZOLAMIDE 250 MG PO TABS: 500.0000 mg | ORAL_TABLET | Freq: Two times a day (BID) | ORAL | 3 refills | Status: DC

## 2020-11-08 NOTE — Telephone Encounter (Signed)
Pt requesting refill for acetaZOLAMIDE (DIAMOX) 250 MG tablet. Pharmacy [facility].

## 2020-11-08 NOTE — Telephone Encounter (Signed)
Refill has been sent to the pharmacy requested by the patient for a 3 mth supply

## 2021-03-14 ENCOUNTER — Other Ambulatory Visit: Payer: Self-pay

## 2021-03-14 ENCOUNTER — Ambulatory Visit (INDEPENDENT_AMBULATORY_CARE_PROVIDER_SITE_OTHER): Admitting: Family Medicine

## 2021-03-14 ENCOUNTER — Encounter: Payer: Self-pay | Admitting: Family Medicine

## 2021-03-14 VITALS — BP 122/82 | HR 60 | Ht 67.0 in | Wt 263.0 lb

## 2021-03-14 DIAGNOSIS — G932 Benign intracranial hypertension: Secondary | ICD-10-CM | POA: Diagnosis not present

## 2021-03-14 MED ORDER — ACETAZOLAMIDE 250 MG PO TABS: 500.0000 mg | ORAL_TABLET | Freq: Two times a day (BID) | ORAL | 3 refills | Status: DC

## 2021-03-14 NOTE — Patient Instructions (Signed)
Below is our plan:  We will continue Diamox 500mg  twice daily. Continue to monitor ear fullness and let me know if it worsens or if you have new symptoms.   Please make sure you are staying well hydrated. I recommend 50-60 ounces daily. Well balanced diet and regular exercise encouraged. Consistent sleep schedule with 6-8 hours recommended.   Please continue follow up with care team as directed.   Follow up with me in 6 months   You may receive a survey regarding today's visit. I encourage you to leave honest feed back as I do use this information to improve patient care. Thank you for seeing me today!

## 2021-03-14 NOTE — Progress Notes (Signed)
PATIENT: Katie Benjamin DOB: [birth year]  REASON FOR VISIT: follow up HISTORY FROM: patient  Chief Complaint  Patient presents with   Follow-up    Rm 1, alone. Here to f/u for IIH. Pt reports trying to wean off Diamox, but had to go back after a week. sx were coming back. On rainy days pt feels more pressure in head and ears feel clogged, hears a muffled sounds. Within the last month.       HISTORY OF PRESENT ILLNESS: 03/15/21 ALL:  Katie Benjamin returns for follow up. We weaned Diamox at last visit 10/2020. Since, she has returned to 500mg  BID. She reports worsening headaches within a week of weaning. She is doing fairly well at this time. No headaches. She does have more fullness of her left ear which was presenting symptom. No vision change. She had normal vision exam over the summer. She is unsure if ear fullness is related to IIH versus allergies. She has gained some weight since last being seen.   10/25/2020 ALL:  Katie Benjamin returns for follow up for IIH. She is doing very well. She has continues Diamox 500mg  BID. Eye exams q55mo have been normal, no edema. She has started eating a Keto syle diet and has lost about 60 pounds. She would like to wean Diamox.   10/25/2019 ALL:  Katie Benjamin is a 40 y.o. female here today for follow up for IIH. She is doing well. She is tolerating Diamox 500mg  twice daily. She is working on healthy lifestyle changes and has lost about 30 pounds. She rpeorts headaches have resolved. She has had follow up with ophthalmology who reports edema is improved. No new vision changes.   HISTORY: (copied from Dr Sammuel Hines note on 04/27/2019)  40 year old female here for evaluation of idiopathic intracranial hypertension (pseudotumor cerebri).   Since 2005 patient has had intermittent blurred vision, headaches, heartbeat sensation in ear, transient visual obscurations.  Symptoms only last a few minutes at a time and go away.  Sometimes her headaches would  increase when she would have weight gain and improve wench her weight would reduce.  Patient does have diagnosis of polycystic ovarian syndrome.   In last few months patient has had increasing left frontal and retro-orbital headaches and pain.  Also increasing blurred vision and tinnitus sensation.  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  Patient referred to ER for evaluation.  Patient had MRI of the brain and orbits which were unremarkable and then LP which showed elevated opening pressure 41 cm water.  Patient started on acetazolamide 250 mg twice a day and doing well.  Some numbness and tingling in hands and feet.  Headache and blurred vision have improved.   REVIEW OF SYSTEMS: Out of a complete 14 system review of symptoms, the patient complains only of the following symptoms, fullness of left ear and all other reviewed systems are negative.   ALLERGIES: No Known Allergies  HOME MEDICATIONS: Outpatient Medications Prior to Visit  Medication Sig Dispense Refill   acetaminophen (TYLENOL) 500 MG tablet Take 500 mg by mouth every 6 (six) hours as needed. 04/27/19 alt w/ibuprofen     Ascorbic Acid (VITAMIN C) 1000 MG tablet Take 1,000 mg by mouth daily.      Cholecalciferol (VITAMIN D3) 50 MCG (2000 UT) TABS Take 2,000 Units by mouth daily.     Multiple Vitamins-Minerals (ONE-A-DAY WOMENS PO) Take 1 tablet by mouth daily with breakfast.     ORAL  ELECTROLYTES PO Take by mouth.     acetaZOLAMIDE (DIAMOX) 250 MG tablet Take 2 tablets (500 mg total) by mouth 2 (two) times daily. 180 tablet 3   No facility-administered medications prior to visit.    PAST MEDICAL HISTORY: Past Medical History:  Diagnosis Date   Anemia 04/2019   Anxiety    Depression    PCOS (polycystic ovarian syndrome)     PAST SURGICAL HISTORY: Past Surgical History:  Procedure Laterality Date   CESAREAN SECTION      FAMILY HISTORY: Family History  Problem Relation Age of  Onset   Hypertension Mother    Diabetes Mother    Heart attack Father    Hypertension Father    Hyperlipidemia Father    Alcohol abuse Father    Menstrual problems Sister    Alcohol abuse Sister    Drug abuse Sister    Obesity Sister    Obesity Sister    Depression Sister     SOCIAL HISTORY: Social History   Socioeconomic History   Marital status: Married    Spouse name: Not on file   Number of children: 1   Years of education: Not on file   Highest education level: Associate degree: academic program  Occupational History    Comment: Print production planner  Tobacco Use   Smoking status: Never   Smokeless tobacco: Never  Substance and Sexual Activity   Alcohol use: No    Alcohol/week: 0.0 standard drinks   Drug use: No   Sexual activity: Yes    Birth control/protection: None  Other Topics Concern   Not on file  Social History Narrative   Lives with family   Caffeine- coffee 1 1/2 c daily, none now   Social Determinants of Corporate investment banker Strain: Not on file  Food Insecurity: Not on file  Transportation Needs: Not on file  Physical Activity: Not on file  Stress: Not on file  Social Connections: Not on file  Intimate Partner Violence: Not on file      PHYSICAL EXAM  Vitals:   03/14/21 1430  BP: 122/82  Pulse: 60  SpO2: 98%  Weight: 263 lb (119.3 kg)  Height: 5\' 7"  (1.702 m)    Body mass index is 41.19 kg/m.  Generalized: Well developed, in no acute distress  Cardiology: normal rate and rhythm, no murmur noted Respiratory: clear to auscultation bilaterally  Neurological examination  Mentation: Alert oriented to time, place, history taking. Follows all commands speech and language fluent Cranial nerve II-XII: Pupils were equal round reactive to light. Extraocular movements were full, visual field were full  Motor: The motor testing reveals 5 over 5 strength of all 4 extremities. Good symmetric motor tone is noted throughout.  Gait and station:  Gait is normal.   DIAGNOSTIC DATA (LABS, IMAGING, TESTING) - I reviewed patient records, labs, notes, testing and imaging myself where available.  No flowsheet data found.   Lab Results  Component Value Date   WBC 8.3 04/21/2019   HGB 8.5 (L) 04/21/2019   HCT 30.0 (L) 04/21/2019   MCV 77.5 (L) 04/21/2019   PLT 459 (H) 04/21/2019      Component Value Date/Time   NA 142 04/21/2019 1759   K 3.9 04/21/2019 1759   CL 109 04/21/2019 1759   CO2 24 04/21/2019 1759   GLUCOSE 100 (H) 04/21/2019 1759   BUN 12 04/21/2019 1759   CREATININE 0.71 04/21/2019 1759   CALCIUM 8.8 (L) 04/21/2019 1759   PROT  6.9 06/30/2011 1233   ALBUMIN 3.7 06/30/2011 1233   AST 20 06/30/2011 1233   ALT 31 06/30/2011 1233   ALKPHOS 91 06/30/2011 1233   BILITOT 0.3 06/30/2011 1233   GFRNONAA >60 04/21/2019 1759   GFRAA >60 04/21/2019 1759   No results found for: CHOL, HDL, LDLCALC, LDLDIRECT, TRIG, CHOLHDL No results found for: RUEA5W No results found for: VITAMINB12 No results found for: TSH     ASSESSMENT AND PLAN 40 y.o. year old female  has a past medical history of Anemia (04/2019), Anxiety, Depression, and PCOS (polycystic ovarian syndrome). here with     ICD-10-CM   1. IIH (idiopathic intracranial hypertension)  G93.2         Dazaria is doing well. She was unable to wean Diamox. We will continue 500mg  BID for now. She will monitor symptom of left ear fullness and notify me for any worsening or new symptoms. She was encouraged to continue healthy lifestyle habits. Continue regular follow up with ophthalmology. She will return to see me in 3-6 months, sooner if needed. She verbalizes understanding and agreement with this plan.    No orders of the defined types were placed in this encounter.    Meds ordered this encounter  Medications   acetaZOLAMIDE (DIAMOX) 250 MG tablet    Sig: Take 2 tablets (500 mg total) by mouth 2 (two) times daily.    Dispense:  360 tablet    Refill:  3    Order  Specific Question:   Supervising Provider    Answer:   , FNP-C 03/15/2021, 8:23 AM [facility] [address] [place], [place] [phone]

## 2021-03-15 ENCOUNTER — Encounter: Payer: Self-pay | Admitting: Family Medicine

## 2021-03-31 ENCOUNTER — Ambulatory Visit (HOSPITAL_COMMUNITY)
Admission: EM | Admit: 2021-03-31 | Discharge: 2021-03-31 | Disposition: A | Discharge status: Home / Self Care | Attending: Internal Medicine | Admitting: Internal Medicine

## 2021-03-31 ENCOUNTER — Other Ambulatory Visit: Payer: Self-pay

## 2021-03-31 ENCOUNTER — Encounter (HOSPITAL_COMMUNITY): Payer: Self-pay | Admitting: Emergency Medicine

## 2021-03-31 DIAGNOSIS — R059 Cough, unspecified: Secondary | ICD-10-CM | POA: Diagnosis present

## 2021-03-31 DIAGNOSIS — R0981 Nasal congestion: Secondary | ICD-10-CM | POA: Insufficient documentation

## 2021-03-31 DIAGNOSIS — U071 COVID-19: Secondary | ICD-10-CM | POA: Insufficient documentation

## 2021-03-31 DIAGNOSIS — J069 Acute upper respiratory infection, unspecified: Secondary | ICD-10-CM | POA: Insufficient documentation

## 2021-03-31 LAB — SARS CORONAVIRUS 2 (TAT 6-24 HRS): SARS Coronavirus 2: POSITIVE — AB

## 2021-03-31 MED ORDER — BENZONATATE 200 MG PO CAPS: 200.0000 mg | ORAL_CAPSULE | Freq: Three times a day (TID) | ORAL | 0 refills | Status: DC | PRN

## 2021-03-31 NOTE — ED Provider Notes (Signed)
[facility]    CSN: [id number] Arrival date & time: 03/31/21  1105      History   Chief Complaint Chief Complaint  Patient presents with   Cough    HPI Katie Benjamin is a 40 y.o. female who presents with nose congestion and cough x 4 days. Denies having a fever.     Past Medical History:  Diagnosis Date   Anemia 04/2019   Anxiety    Depression    PCOS (polycystic ovarian syndrome)     Patient Active Problem List   Diagnosis Date Noted   Bilateral polycystic ovarian syndrome 02/08/2014    Past Surgical History:  Procedure Laterality Date   CESAREAN SECTION      OB History   No obstetric history on file.      Home Medications    Prior to Admission medications   Medication Sig Start Date End Date Taking? Authorizing Provider  acetaZOLAMIDE (DIAMOX) 250 MG tablet Take 2 tablets (500 mg total) by mouth 2 (two) times daily. 03/14/21  Yes Lomax, Amy, NP  acetaminophen (TYLENOL) 500 MG tablet Take 500 mg by mouth every 6 (six) hours as needed. 04/27/19 alt w/ibuprofen    [provider]  Ascorbic Acid (VITAMIN C) 1000 MG tablet Take 1,000 mg by mouth daily.     [provider]  Cholecalciferol (VITAMIN D3) 50 MCG (2000 UT) TABS Take 2,000 Units by mouth daily.    [provider]  Multiple Vitamins-Minerals (ONE-A-DAY WOMENS PO) Take 1 tablet by mouth daily with breakfast.    [provider]  ORAL ELECTROLYTES PO Take by mouth.    [provider]    Family History Family History  Problem Relation Age of Onset   Hypertension Mother    Diabetes Mother    Heart attack Father    Hypertension Father    Hyperlipidemia Father    Alcohol abuse Father    Menstrual problems Sister    Alcohol abuse Sister    Drug abuse Sister    Obesity Sister    Obesity Sister    Depression Sister     Social History Social History   Tobacco Use   Smoking status: Never   Smokeless tobacco: Never  Substance Use  Topics   Alcohol use: No    Alcohol/week: 0.0 standard drinks   Drug use: No     Allergies   Patient has no known allergies.   Review of Systems Review of Systems  HENT:  Positive for congestion and rhinorrhea.   Respiratory:  Positive for cough.   The rest is negative  Physical Exam Triage Vital Signs ED Triage Vitals  Enc Vitals Group     BP 03/31/21 1156 (!) 157/92     Pulse Rate 03/31/21 1156 69     Resp 03/31/21 1156 18     Temp 03/31/21 1156 97.9 F (36.6 C)     Temp Source 03/31/21 1156 Oral     SpO2 03/31/21 1156 95 %     Weight --      Height --      Head Circumference --      Peak Flow --      Pain Score 03/31/21 1158 0     Pain Loc --      Pain Edu? --      Excl. in GC? --    No data found.  Updated Vital Signs BP (!) 157/92 (BP Location: Right Arm)    Pulse  69    Temp 97.9 F (36.6 C) (Oral)    Resp 18    LMP 02/14/2021 (Approximate)    SpO2 95%   Visual Acuity Right Eye Distance:   Left Eye Distance:   Bilateral Distance:    Right Eye Near:   Left Eye Near:    Bilateral Near:       Physical Exam Vitals signs and nursing note reviewed.  Constitutional:      General: She is not in acute distress.    Appearance: Normal appearance. She is not ill-appearing, toxic-appearing or diaphoretic.  HENT:     Head: Normocephalic.     Right Ear: Tympanic membrane, ear canal and external ear normal.     Left Ear: Tympanic membrane, ear canal and external ear normal.     Nose: moderate congestion with clear mucous     Mouth/Throat:     Mouth: Mucous membranes are moist.  Eyes:     General: No scleral icterus.       Right eye: No discharge.        Left eye: No discharge.     Conjunctiva/sclera: Conjunctivae normal.  Neck:     Musculoskeletal: Neck supple. No neck rigidity.  Cardiovascular:     Rate and Rhythm: Normal rate and regular rhythm.     Heart sounds: No murmur.  Pulmonary:     Effort: Pulmonary effort is normal.     Breath sounds:  Normal breath sounds.  AMusculoskeletal: Normal range of motion.  Lymphadenopathy:     Cervical: No cervical adenopathy.  Skin:    General: Skin is warm and dry.     Coloration: Skin is not jaundiced.     Findings: No rash.  Neurological:     Mental Status: She is alert and oriented to person, place, and time.     Gait: Gait normal.  Psychiatric:        Mood and Affect: Mood normal.        Behavior: Behavior normal.        Thought Content: Thought content normal.        Judgment: Judgment normal.   UC Treatments / Results  Labs (all labs ordered are listed, but only abnormal results are displayed) Labs Reviewed  SARS CORONAVIRUS 2 (TAT 6-24 HRS)    EKG   Radiology No results found.  Procedures Procedures (including critical care time)  Medications Ordered in UC Medications - No data to display  Initial Impression / Assessment and Plan / UC Course  I have reviewed the triage vital signs and the nursing notes. Pertinent labs  results that were available during my care of the patient were reviewed by me and considered in my medical decision making (see chart for details). URI Covid test is pending I placed her on Tessalon as noted. See instructions.     Final Clinical Impressions(s) / UC Diagnoses   Final diagnoses:  None   Discharge Instructions   None    ED Prescriptions   None    PDMP not reviewed this encounter.   Garey Ham, PA-C 03/31/21 1338

## 2021-03-31 NOTE — ED Triage Notes (Signed)
Patient c/o nonproductive cough x 4 day.   Patient endorses chest congestion.   Patient denies fever.   Patient has taken Mucinex with no relief of symptoms.

## 2021-03-31 NOTE — Discharge Instructions (Signed)
Stay quarantined until the Covid test is back. If positive, then you need to stay quarantined for 5 days, and wear a mask for 5 days in the public there after.  We will call your mother if the covid test is positive in 1-3 days

## 2021-12-05 NOTE — Progress Notes (Signed)
PATIENT: Katie Benjamin DOB: 02-15-1981  REASON FOR VISIT: follow up HISTORY FROM: patient  Chief Complaint  Patient presents with   Follow-up    Pt alone, rm 2. Over the last 2 months she has noticed a increase in her symptoms. The headaches, "whoosing sounds", pressure in eyes. She has a apt scheduled with eye MD. She notes dizziness as well as light sensitivity     HISTORY OF PRESENT ILLNESS:  12/11/21 ALL:  Katie Benjamin returns for follow up for IIH. She continues Diamox 500mg  BID. She reports doing really well since last visit 03/2021. She had lost about 20lbs. Over the past few weeks, she has noticed that she was having more headaches. She has also noted more "whooshing sounds" and pressure behind eyes. She reports walking up a set of stairs recently and reports feeling dizzy. She denies TVOs or vision changes. She is taking ibuprofen most days. She is drinking about 80 ounces of water daily. She does mention a change in diet about 1-2 weeks before headaches worsened. She does not routinely check BP at home.   03/14/2021 ALL: Katie Benjamin returns for follow up. We weaned Diamox at last visit 10/2020. Since, she has returned to 500mg  BID. She reports worsening headaches within a week of weaning. She is doing fairly well at this time. No headaches. She does have more fullness of her left ear which was presenting symptom. No vision change. She had normal vision exam over the summer. She is unsure if ear fullness is related to IIH versus allergies. She has gained some weight since last being seen.   10/25/2020 ALL:  Katie Benjamin returns for follow up for IIH. She is doing very well. She has continues Diamox 500mg  BID. Eye exams q72mo have been normal, no edema. She has started eating a Keto syle diet and has lost about 60 pounds. She would like to wean Diamox.   10/25/2019 ALL:  Katie Benjamin is a 41 y.o. female here today for follow up for IIH. She is doing well. She is tolerating Diamox 500mg   twice daily. She is working on healthy lifestyle changes and has lost about 30 pounds. She rpeorts headaches have resolved. She has had follow up with ophthalmology who reports edema is improved. No new vision changes.   HISTORY: (copied from Dr 10/27/2019 note on 04/27/2019)  41 year old female here for evaluation of idiopathic intracranial hypertension (pseudotumor cerebri).   Since 2005 patient has had intermittent blurred vision, headaches, heartbeat sensation in ear, transient visual obscurations.  Symptoms only last a few minutes at a time and go away.  Sometimes her headaches would increase when she would have weight gain and improve wench her weight would reduce.  Patient does have diagnosis of polycystic ovarian syndrome.   In last few months patient has had increasing left frontal and retro-orbital headaches and pain.  Also increasing blurred vision and tinnitus sensation.  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  Patient referred to ER for evaluation.  Patient had MRI of the brain and orbits which were unremarkable and then LP which showed elevated opening pressure 41 cm water.  Patient started on acetazolamide 250 mg twice a day and doing well.  Some numbness and tingling in hands and feet.  Headache and blurred vision have improved.   REVIEW OF SYSTEMS: Out of a complete 14 system review of symptoms, the patient complains only of the following symptoms, headaches, fullness of left ear and all other  reviewed systems are negative.   ALLERGIES: No Known Allergies  HOME MEDICATIONS: Outpatient Medications Prior to Visit  Medication Sig Dispense Refill   acetaminophen (TYLENOL) 500 MG tablet Take 500 mg by mouth every 6 (six) hours as needed. 04/27/19 alt w/ibuprofen     Ascorbic Acid (VITAMIN C) 1000 MG tablet Take 1,000 mg by mouth daily.      Cholecalciferol (VITAMIN D3) 50 MCG (2000 UT) TABS Take 2,000 Units by mouth daily.     Multiple  Vitamins-Minerals (ONE-A-DAY WOMENS PO) Take 1 tablet by mouth daily with breakfast.     ORAL ELECTROLYTES PO Take by mouth.     acetaZOLAMIDE (DIAMOX) 250 MG tablet Take 2 tablets (500 mg total) by mouth 2 (two) times daily. 360 tablet 3   benzonatate (TESSALON) 200 MG capsule Take 1 capsule (200 mg total) by mouth 3 (three) times daily as needed for cough. 30 capsule 0   No facility-administered medications prior to visit.    PAST MEDICAL HISTORY: Past Medical History:  Diagnosis Date   Anemia 04/2019   Anxiety    Depression    PCOS (polycystic ovarian syndrome)     PAST SURGICAL HISTORY: Past Surgical History:  Procedure Laterality Date   CESAREAN SECTION      FAMILY HISTORY: Family History  Problem Relation Age of Onset   Hypertension Mother    Diabetes Mother    Heart attack Father    Hypertension Father    Hyperlipidemia Father    Alcohol abuse Father    Menstrual problems Sister    Alcohol abuse Sister    Drug abuse Sister    Obesity Sister    Obesity Sister    Depression Sister     SOCIAL HISTORY: Social History   Socioeconomic History   Marital status: Married    Spouse name: Not on file   Number of children: 1   Years of education: Not on file   Highest education level: Associate degree: academic program  Occupational History    Comment: Print production planner  Tobacco Use   Smoking status: Never   Smokeless tobacco: Never  Substance and Sexual Activity   Alcohol use: No    Alcohol/week: 0.0 standard drinks of alcohol   Drug use: No   Sexual activity: Yes    Birth control/protection: None  Other Topics Concern   Not on file  Social History Narrative   Lives with family   Caffeine- coffee 1 1/2 c daily, none now   Social Determinants of Corporate investment banker Strain: Not on file  Food Insecurity: Not on file  Transportation Needs: Not on file  Physical Activity: Not on file  Stress: Not on file  Social Connections: Not on file  Intimate  Partner Violence: Not on file      PHYSICAL EXAM  Vitals:   12/11/21 1328  BP: (!) 147/91  Pulse: 67  Weight: 247 lb (112 kg)  Height: 5\' 7"  (1.702 m)     Body mass index is 38.69 kg/m.  Generalized: Well developed, in no acute distress  Cardiology: normal rate and rhythm, no murmur noted Respiratory: clear to auscultation bilaterally  Neurological examination  Mentation: Alert oriented to time, place, history taking. Follows all commands speech and language fluent Cranial nerve II-XII: Pupils were equal round reactive to light. Extraocular movements were full, visual field were full  Motor: The motor testing reveals 5 over 5 strength of all 4 extremities. Good symmetric motor tone is noted throughout.  Gait and station: Gait is normal.   DIAGNOSTIC DATA (LABS, IMAGING, TESTING) - I reviewed patient records, labs, notes, testing and imaging myself where available.      No data to display           Lab Results  Component Value Date   WBC 8.3 04/21/2019   HGB 8.5 (L) 04/21/2019   HCT 30.0 (L) 04/21/2019   MCV 77.5 (L) 04/21/2019   PLT 459 (H) 04/21/2019      Component Value Date/Time   NA 142 04/21/2019 1759   K 3.9 04/21/2019 1759   CL 109 04/21/2019 1759   CO2 24 04/21/2019 1759   GLUCOSE 100 (H) 04/21/2019 1759   BUN 12 04/21/2019 1759   CREATININE 0.71 04/21/2019 1759   CALCIUM 8.8 (L) 04/21/2019 1759   PROT 6.9 06/30/2011 1233   ALBUMIN 3.7 06/30/2011 1233   AST 20 06/30/2011 1233   ALT 31 06/30/2011 1233   ALKPHOS 91 06/30/2011 1233   BILITOT 0.3 06/30/2011 1233   GFRNONAA >60 04/21/2019 1759   GFRAA >60 04/21/2019 1759   No results found for: "CHOL", "HDL", "LDLCALC", "LDLDIRECT", "TRIG", "CHOLHDL" No results found for: "HGBA1C" No results found for: "VITAMINB12" No results found for: "TSH"     ASSESSMENT AND PLAN 41 y.o. year old female  has a past medical history of Anemia (04/2019), Anxiety, Depression, and PCOS (polycystic ovarian  syndrome). here with     ICD-10-CM   1. IIH (idiopathic intracranial hypertension)  G93.2        Amaya has noticed an increase in headache frequency over the past 3-4 weeks. She denies vision changes. BP is elevated, today. We will continue 500mg  BID for now. She has appt with ophthalmology tomorrow and will call me with results. I have also encouraged her to monitor BP at home for the next 2 weeks and send me readings. We may increase Diamox pending eye exam or consider discussion with PCP regarding BP management if needed. She was encouraged to continue healthy lifestyle habits. She was commended for 20lb weight loss. She will return to see me in 6 months, sooner if needed. She verbalizes understanding and agreement with this plan.    No orders of the defined types were placed in this encounter.     Meds ordered this encounter  Medications   acetaZOLAMIDE (DIAMOX) 250 MG tablet    Sig: Take 2 tablets (500 mg total) by mouth 2 (two) times daily.    Dispense:  360 tablet    Refill:  3    Order Specific Question:   Supervising Provider    Answer:   Anson Fret, FNP-C 12/11/2021, 2:02 PM [facility] [address] [place], [place] [phone]

## 2021-12-11 ENCOUNTER — Encounter: Payer: Self-pay | Admitting: Family Medicine

## 2021-12-11 ENCOUNTER — Ambulatory Visit (INDEPENDENT_AMBULATORY_CARE_PROVIDER_SITE_OTHER): Admitting: Family Medicine

## 2021-12-11 VITALS — BP 147/91 | HR 67 | Ht 67.0 in | Wt 247.0 lb

## 2021-12-11 DIAGNOSIS — G932 Benign intracranial hypertension: Secondary | ICD-10-CM | POA: Diagnosis not present

## 2021-12-11 MED ORDER — ACETAZOLAMIDE 250 MG PO TABS: 500.0000 mg | ORAL_TABLET | Freq: Two times a day (BID) | ORAL | 3 refills | Status: DC

## 2021-12-11 NOTE — Patient Instructions (Addendum)
Below is our plan:  We will continue Diamox 500mg  BID for now. Let me know what ophthalmology about eye exam tomorrow. Check blood pressure at home 2-3 times a week for the next 2 weeks. If blood pressure is elevated, consider discussion with PCP. Limit sodium intake.   Please make sure you are staying well hydrated. I recommend 50-60 ounces daily. Well balanced diet and regular exercise encouraged. Consistent sleep schedule with 6-8 hours recommended.   Please continue follow up with care team as directed.   Follow up with me in 6-12 months   You may receive a survey regarding today's visit. I encourage you to leave honest feed back as I do use this information to improve patient care. Thank you for seeing me today!

## 2021-12-17 ENCOUNTER — Other Ambulatory Visit: Payer: Self-pay | Admitting: Family Medicine

## 2021-12-17 ENCOUNTER — Encounter: Payer: Self-pay | Admitting: Family Medicine

## 2021-12-17 MED ORDER — ACETAZOLAMIDE ER 500 MG PO CP12: 1000.0000 mg | ORAL_CAPSULE | Freq: Two times a day (BID) | ORAL | 3 refills | Status: DC

## 2022-01-23 ENCOUNTER — Other Ambulatory Visit: Payer: Self-pay

## 2022-01-23 ENCOUNTER — Ambulatory Visit (HOSPITAL_COMMUNITY)
Admission: EM | Admit: 2022-01-23 | Discharge: 2022-01-23 | Disposition: A | Discharge status: Home / Self Care | Attending: Internal Medicine | Admitting: Internal Medicine

## 2022-01-23 ENCOUNTER — Encounter (HOSPITAL_COMMUNITY): Payer: Self-pay | Admitting: Emergency Medicine

## 2022-01-23 DIAGNOSIS — S0502XA Injury of conjunctiva and corneal abrasion without foreign body, left eye, initial encounter: Secondary | ICD-10-CM | POA: Diagnosis not present

## 2022-01-23 MED ORDER — ERYTHROMYCIN 5 MG/GM OP OINT: TOPICAL_OINTMENT | OPHTHALMIC | 0 refills | Status: DC

## 2022-01-23 NOTE — Discharge Instructions (Signed)
Your exam shows a corneal abrasion of your left eye. Apply erythromycin eye ointment to the eye twice daily for the next 7 days. Apply warm compresses to your eye prior to applying erythromycin eye ointment for the next 7 days as well to reduce infection. Wash your hands frequently to prevent infection. You may use Tylenol or ibuprofen over-the-counter as needed for pain. Work note was at the end [employer].  Follow-up with ophthalmology/eye specialist if needed if symptoms fail to improve in the next 5 to 7 days.  If you develop any new or worsening symptoms or do not improve in the next 2 to 3 days, please return.  If your symptoms are severe, please go to the emergency room.  Follow-up with your primary care provider for further evaluation and management of your symptoms as well as ongoing wellness visits.  I hope you feel better!

## 2022-01-23 NOTE — ED Triage Notes (Signed)
Yesterday while getting ready to go to work, patient noticed a "ripping" or scratch across left eye lid.  Reports this is a sensation she has felt before when waking in the morning.  Patient does not wear contacts.  Patient is wearing prescription glasses.  Patient does have an eye specialist.   Has used lubricating drops in eye.    Denies any vision changes.    Sun and bright lights are painful.

## 2022-01-23 NOTE — ED Provider Notes (Signed)
[facility]    CSN: [id number] Arrival date & time: 01/23/22  1750      History   Chief Complaint Chief Complaint  Patient presents with   Eye Pain    HPI Katie Benjamin is a 41 y.o. female.   Patient presents urgent care for evaluation of left eye pain that started yesterday morning.  Patient states that she was using a make-up brush to apply make-up and ran her finger to her lower left eyelid to fix the make-up that she had used with a new brush.  She felt a "ripping or tearing" sensation across the left lower eyelid.  She then began to rub the eye and became very red, swollen, and painful worsening today.  Patient wears glasses for vision correction and does not wear contact lenses.  She attempted use of lubricating eyedrops over-the-counter but this has not helped very much.  Pain to the left eye is currently a 7 on a scale of 0-10.  There is no swelling, warmth, or redness to the surrounding areas of the eye.  Denies foreign body/trauma to the left eye.  No fever or chills, blurry vision, decreased visual acuity, or head pain.   Eye Pain    Past Medical History:  Diagnosis Date   Anemia 04/2019   Anxiety    Depression    PCOS (polycystic ovarian syndrome)     Patient Active Problem List   Diagnosis Date Noted   Bilateral polycystic ovarian syndrome 02/08/2014    Past Surgical History:  Procedure Laterality Date   CESAREAN SECTION      OB History   No obstetric history on file.      Home Medications    Prior to Admission medications   Medication Sig Start Date End Date Taking? Authorizing Provider  erythromycin ophthalmic ointment Place a 1/2 inch ribbon of ointment into the lower eyelid. 01/23/22  Yes Talbot Grumbling, FNP  acetaminophen (TYLENOL) 500 MG tablet Take 500 mg by mouth every 6 (six) hours as needed. 04/27/19 alt w/ibuprofen    [provider]  acetaZOLAMIDE ER (DIAMOX) 500 MG capsule Take 2 capsules (1,000 mg total)  by mouth 2 (two) times daily. 12/17/21   Lomax, Amy, NP  Ascorbic Acid (VITAMIN C) 1000 MG tablet Take 1,000 mg by mouth daily.     [provider]  Cholecalciferol (VITAMIN D3) 50 MCG (2000 UT) TABS Take 2,000 Units by mouth daily.    [provider]  Multiple Vitamins-Minerals (ONE-A-DAY WOMENS PO) Take 1 tablet by mouth daily with breakfast.    [provider]  ORAL ELECTROLYTES PO Take by mouth.    [provider]    Family History Family History  Problem Relation Age of Onset   Hypertension Mother    Diabetes Mother    Heart attack Father    Hypertension Father    Hyperlipidemia Father    Alcohol abuse Father    Menstrual problems Sister    Alcohol abuse Sister    Drug abuse Sister    Obesity Sister    Obesity Sister    Depression Sister     Social History Social History   Tobacco Use   Smoking status: Never   Smokeless tobacco: Never  Vaping Use   Vaping Use: Never used  Substance Use Topics   Alcohol use: No    Alcohol/week: 0.0 standard drinks of alcohol   Drug use: No     Allergies   Patient has no  known allergies.   Review of Systems Review of Systems  Eyes:  Positive for pain.  Per HPI   Physical Exam Triage Vital Signs ED Triage Vitals  Enc Vitals Group     BP 01/23/22 1859 130/85     Pulse Rate 01/23/22 1859 66     Resp 01/23/22 1859 20     Temp 01/23/22 1859 97.7 F (36.5 C)     Temp Source 01/23/22 1859 Oral     SpO2 01/23/22 1859 97 %     Weight --      Height --      Head Circumference --      Peak Flow --      Pain Score 01/23/22 1856 7     Pain Loc --      Pain Edu? --      Excl. in GC? --    No data found.  Updated Vital Signs BP 130/85 (BP Location: Left Arm) Comment (BP Location): large cuff  Pulse 66   Temp 97.7 F (36.5 C) (Oral)   Resp 20   SpO2 97%   Visual Acuity Right Eye Distance: with glasses 20/15 Left Eye Distance: with glasses 20/15 Bilateral Distance: with glasses  20/13  Right Eye Near:   Left Eye Near:    Bilateral Near:     Physical Exam Vitals and nursing note reviewed.  Constitutional:      Appearance: She is not ill-appearing or toxic-appearing.  HENT:     Head: Normocephalic and atraumatic.     Right Ear: Hearing and external ear normal.     Left Ear: Hearing and external ear normal.     Nose: Nose normal.     Mouth/Throat:     Lips: Pink.  Eyes:     General: Lids are normal. Vision grossly intact. Gaze aligned appropriately.     Extraocular Movements: Extraocular movements intact.     Conjunctiva/sclera: Conjunctivae normal.     Pupils: Pupils are equal, round, and reactive to light.     Left eye: Corneal abrasion and fluorescein uptake present.     Comments: Fluorescein uptake present to the inferior aspect of the left sclera indicating corneal abrasion to the left eye underneath the lower lid.  Mild drainage present to the left eye that is clear in color.  Patient experienced pain relief with application of numbing eyedrops during fluorescein staining procedure.  Pulmonary:     Effort: Pulmonary effort is normal.  Musculoskeletal:     Cervical back: Neck supple.  Skin:    General: Skin is warm and dry.     Capillary Refill: Capillary refill takes less than 2 seconds.     Findings: No rash.  Neurological:     General: No focal deficit present.     Mental Status: She is alert and oriented to person, place, and time. Mental status is at baseline.     Cranial Nerves: No dysarthria or facial asymmetry.  Psychiatric:        Mood and Affect: Mood normal.        Speech: Speech normal.        Behavior: Behavior normal.        Thought Content: Thought content normal.        Judgment: Judgment normal.      UC Treatments / Results  Labs (all labs ordered are listed, but only abnormal results are displayed) Labs Reviewed - No data to display  EKG   Radiology No results found.  Procedures Procedures (including critical care  time)  Medications Ordered in UC Medications - No data to display  Initial Impression / Assessment and Plan / UC Course  I have reviewed the triage vital signs and the nursing notes.  Pertinent labs & imaging results that were available during my care of the patient were reviewed by me and considered in my medical decision making (see chart for details).   1.  Abrasion of left cornea Erythromycin eye ointment twice daily for the next 7 days prescribed.  Warm compresses to the left eye prior to applying erythromycin eye ointment advised for the next 7 days.  Advise washing hands frequently and Tylenol/ibuprofen use as needed for any pain to the left eye.  Ophthalmology walking referral given if symptoms fail to improve within the next 5 to 7 days.  Discussed physical exam and available lab work findings in clinic with patient.  Counseled patient regarding appropriate use of medications and potential side effects for all medications recommended or prescribed today. Discussed red flag signs and symptoms of worsening condition,when to call the PCP office, return to urgent care, and when to seek higher level of care in the emergency department. Patient verbalizes understanding and agreement with plan. All questions answered. Patient discharged in stable condition.    Final Clinical Impressions(s) / UC Diagnoses   Final diagnoses:  Abrasion of left cornea, initial encounter     Discharge Instructions      Your exam shows a corneal abrasion of your left eye. Apply erythromycin eye ointment to the eye twice daily for the next 7 days. Apply warm compresses to your eye prior to applying erythromycin eye ointment for the next 7 days as well to reduce infection. Wash your hands frequently to prevent infection. You may use Tylenol or ibuprofen over-the-counter as needed for pain. Work note was at the end [employer].  Follow-up with ophthalmology/eye specialist if needed if symptoms fail to  improve in the next 5 to 7 days.  If you develop any new or worsening symptoms or do not improve in the next 2 to 3 days, please return.  If your symptoms are severe, please go to the emergency room.  Follow-up with your primary care provider for further evaluation and management of your symptoms as well as ongoing wellness visits.  I hope you feel better!     ED Prescriptions     Medication Sig Dispense Auth. Provider   erythromycin ophthalmic ointment Place a 1/2 inch ribbon of ointment into the lower eyelid. 3.5 g Carlisle Beers, FNP      PDMP not reviewed this encounter.   Carlisle Beers, [place] 01/23/22 2146

## 2022-06-11 NOTE — Progress Notes (Unsigned)
PATIENT: Katie Benjamin DOB: 03-27-1981  REASON FOR VISIT: follow up HISTORY FROM: patient  No chief complaint on file.    HISTORY OF PRESENT ILLNESS:  06/11/22 ALL:  Katie Benjamin returns for follow up for IIH. She was last seen 12/17/2021 and felt to be doing well on acetazolamide '500mg'$  BID but after eye exam 12/17/2021, she was advised that papilledema had returned and we increased acetazolamide to '1000mg'$  BID. Since,   12/11/2021 ALL: Katie Benjamin returns for follow up for IIH. She continues Diamox '500mg'$  BID. She reports doing really well since last visit 03/2021. She had lost about 20lbs. Over the past few weeks, she has noticed that she was having more headaches. She has also noted more "whooshing sounds" and pressure behind eyes. She reports walking up a set of stairs recently and reports feeling dizzy. She denies TVOs or vision changes. She is taking ibuprofen most days. She is drinking about 80 ounces of water daily. She does mention a change in diet about 1-2 weeks before headaches worsened. She does not routinely check BP at home.   03/14/2021 ALL: Katie Benjamin returns for follow up. We weaned Diamox at last visit 10/2020. Since, she has returned to '500mg'$  BID. She reports worsening headaches within a week of weaning. She is doing fairly well at this time. No headaches. She does have more fullness of her left ear which was presenting symptom. No vision change. She had normal vision exam over the summer. She is unsure if ear fullness is related to IIH versus allergies. She has gained some weight since last being seen.   10/25/2020 ALL:  Katie Benjamin returns for follow up for IIH. She is doing very well. She has continues Diamox '500mg'$  BID. Eye exams q27mohave been normal, no edema. She has started eating a Keto syle diet and has lost about 60 pounds. She would like to wean Diamox.   10/25/2019 ALL:  Katie LAPANis a 42y.o. female here today for follow up for IIH. She is doing well. She is  tolerating Diamox '500mg'$  twice daily. She is working on healthy lifestyle changes and has lost about 30 pounds. She rpeorts headaches have resolved. She has had follow up with ophthalmology who reports edema is improved. No new vision changes.   HISTORY: (copied from Dr PGladstone Lighternote on 04/27/2019)  42year old female here for evaluation of idiopathic intracranial hypertension (pseudotumor cerebri).   Since 2005 patient has had intermittent blurred vision, headaches, heartbeat sensation in ear, transient visual obscurations.  Symptoms only last a few minutes at a time and go away.  Sometimes her headaches would increase when she would have weight gain and improve wench her weight would reduce.  Patient does have diagnosis of polycystic ovarian syndrome.   In last few months patient has had increasing left frontal and retro-orbital headaches and pain.  Also increasing blurred vision and tinnitus sensation.  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  Patient referred to ER for evaluation.  Patient had MRI of the brain and orbits which were unremarkable and then LP which showed elevated opening pressure 41 cm water.  Patient started on acetazolamide 250 mg twice a day and doing well.  Some numbness and tingling in hands and feet.  Headache and blurred vision have improved.   REVIEW OF SYSTEMS: Out of a complete 14 system review of symptoms, the patient complains only of the following symptoms, headaches, fullness of left ear and all other reviewed systems  are negative.   ALLERGIES: No Known Allergies  HOME MEDICATIONS: Outpatient Medications Prior to Visit  Medication Sig Dispense Refill   acetaminophen (TYLENOL) 500 MG tablet Take 500 mg by mouth every 6 (six) hours as needed. 04/27/19 alt w/ibuprofen     acetaZOLAMIDE ER (DIAMOX) 500 MG capsule Take 2 capsules (1,000 mg total) by mouth 2 (two) times daily. 360 capsule 3   Ascorbic Acid (VITAMIN C) 1000 MG  tablet Take 1,000 mg by mouth daily.      Cholecalciferol (VITAMIN D3) 50 MCG (2000 UT) TABS Take 2,000 Units by mouth daily.     erythromycin ophthalmic ointment Place a 1/2 inch ribbon of ointment into the lower eyelid. 3.5 g 0   Multiple Vitamins-Minerals (ONE-A-DAY WOMENS PO) Take 1 tablet by mouth daily with breakfast.     ORAL ELECTROLYTES PO Take by mouth.     No facility-administered medications prior to visit.    PAST MEDICAL HISTORY: Past Medical History:  Diagnosis Date   Anemia 04/2019   Anxiety    Depression    PCOS (polycystic ovarian syndrome)     PAST SURGICAL HISTORY: Past Surgical History:  Procedure Laterality Date   CESAREAN SECTION      FAMILY HISTORY: Family History  Problem Relation Age of Onset   Hypertension Mother    Diabetes Mother    Heart attack Father    Hypertension Father    Hyperlipidemia Father    Alcohol abuse Father    Menstrual problems Sister    Alcohol abuse Sister    Drug abuse Sister    Obesity Sister    Obesity Sister    Depression Sister     SOCIAL HISTORY: Social History   Socioeconomic History   Marital status: Married    Spouse name: Not on file   Number of children: 1   Years of education: Not on file   Highest education level: Associate degree: academic program  Occupational History    Comment: Glass blower/designer  Tobacco Use   Smoking status: Never   Smokeless tobacco: Never  Vaping Use   Vaping Use: Never used  Substance and Sexual Activity   Alcohol use: No    Alcohol/week: 0.0 standard drinks of alcohol   Drug use: No   Sexual activity: Yes    Birth control/protection: None  Other Topics Concern   Not on file  Social History Narrative   Lives with family   Caffeine- coffee 1 1/2 c daily, none now   Social Determinants of Health   Financial Resource Strain: Not on file  Food Insecurity: Not on file  Transportation Needs: Not on file  Physical Activity: Not on file  Stress: Not on file  Social  Connections: Not on file  Intimate Partner Violence: Not on file      PHYSICAL EXAM  There were no vitals filed for this visit.    There is no height or weight on file to calculate BMI.  Generalized: Well developed, in no acute distress  Cardiology: normal rate and rhythm, no murmur noted Respiratory: clear to auscultation bilaterally  Neurological examination  Mentation: Alert oriented to time, place, history taking. Follows all commands speech and language fluent Cranial nerve II-XII: Pupils were equal round reactive to light. Extraocular movements were full, visual field were full  Motor: The motor testing reveals 5 over 5 strength of all 4 extremities. Good symmetric motor tone is noted throughout.  Gait and station: Gait is normal.   DIAGNOSTIC DATA (LABS,  IMAGING, TESTING) - I reviewed patient records, labs, notes, testing and imaging myself where available.      No data to display           Lab Results  Component Value Date   WBC 8.3 04/21/2019   HGB 8.5 (L) 04/21/2019   HCT 30.0 (L) 04/21/2019   MCV 77.5 (L) 04/21/2019   PLT 459 (H) 04/21/2019      Component Value Date/Time   NA 142 04/21/2019 1759   K 3.9 04/21/2019 1759   CL 109 04/21/2019 1759   CO2 24 04/21/2019 1759   GLUCOSE 100 (H) 04/21/2019 1759   BUN 12 04/21/2019 1759   CREATININE 0.71 04/21/2019 1759   CALCIUM 8.8 (L) 04/21/2019 1759   PROT 6.9 06/30/2011 1233   ALBUMIN 3.7 06/30/2011 1233   AST 20 06/30/2011 1233   ALT 31 06/30/2011 1233   ALKPHOS 91 06/30/2011 1233   BILITOT 0.3 06/30/2011 1233   GFRNONAA >60 04/21/2019 1759   GFRAA >60 04/21/2019 1759   No results found for: "CHOL", "HDL", "LDLCALC", "LDLDIRECT", "TRIG", "CHOLHDL" No results found for: "HGBA1C" No results found for: "VITAMINB12" No results found for: "TSH"     ASSESSMENT AND PLAN 42 y.o. year old female  has a past medical history of Anemia (04/2019), Anxiety, Depression, and PCOS (polycystic ovarian  syndrome). here with   No diagnosis found.    Valentina has noticed an increase in headache frequency over the past 3-4 weeks. She denies vision changes. BP is elevated, today. We will continue '500mg'$  BID for now. She has appt with ophthalmology tomorrow and will call me with results. I have also encouraged her to monitor BP at home for the next 2 weeks and send me readings. We may increase Diamox pending eye exam or consider discussion with PCP regarding BP management if needed. She was encouraged to continue healthy lifestyle habits. She was commended for 20lb weight loss. She will return to see me in 6 months, sooner if needed. She verbalizes understanding and agreement with this plan.    No orders of the defined types were placed in this encounter.     No orders of the defined types were placed in this encounter.      Debbora Presto, FNP-C 06/11/2022, 4:26 PM [facility] [address], [place], [place] [address] [phone]

## 2022-06-11 NOTE — Patient Instructions (Signed)
Below is our plan:  We will continue Diamox 1000mg  twice daily.   Please make sure you are staying well hydrated. I recommend 50-60 ounces daily. Well balanced diet and regular exercise encouraged. Consistent sleep schedule with 6-8 hours recommended.   Please continue follow up with care team as directed.   Follow up with me in 1 year   You may receive a survey regarding today's visit. I encourage you to leave honest feed back as I do use this information to improve patient care. Thank you for seeing me today!

## 2022-06-12 ENCOUNTER — Ambulatory Visit (INDEPENDENT_AMBULATORY_CARE_PROVIDER_SITE_OTHER): Admitting: Family Medicine

## 2022-06-12 ENCOUNTER — Encounter: Payer: Self-pay | Admitting: Family Medicine

## 2022-06-12 VITALS — BP 142/78 | HR 52 | Ht 67.0 in | Wt 246.6 lb

## 2022-06-12 DIAGNOSIS — G932 Benign intracranial hypertension: Secondary | ICD-10-CM | POA: Diagnosis not present

## 2022-06-12 MED ORDER — ACETAZOLAMIDE ER 500 MG PO CP12: 1000.0000 mg | ORAL_CAPSULE | Freq: Two times a day (BID) | ORAL | 3 refills | Status: DC

## 2022-07-11 ENCOUNTER — Encounter: Payer: Self-pay | Admitting: Family Medicine

## 2022-07-11 MED ORDER — ACETAZOLAMIDE ER 500 MG PO CP12: ORAL_CAPSULE | ORAL | 0 refills | Status: DC

## 2022-07-30 ENCOUNTER — Other Ambulatory Visit: Payer: Self-pay

## 2022-07-30 ENCOUNTER — Encounter: Payer: Self-pay | Admitting: Emergency Medicine

## 2022-07-30 ENCOUNTER — Ambulatory Visit
Admission: EM | Admit: 2022-07-30 | Discharge: 2022-07-30 | Disposition: A | Discharge status: Home / Self Care | Attending: Physician Assistant | Admitting: Physician Assistant

## 2022-07-30 DIAGNOSIS — H1031 Unspecified acute conjunctivitis, right eye: Secondary | ICD-10-CM | POA: Diagnosis not present

## 2022-07-30 MED ORDER — POLYMYXIN B-TRIMETHOPRIM 10000-0.1 UNIT/ML-% OP SOLN: 1.0000 [drp] | Freq: Four times a day (QID) | OPHTHALMIC | 0 refills | Status: AC

## 2022-07-30 NOTE — Discharge Instructions (Signed)
There is no corneal abrasion on exam.  I am concerned that you have an infection in your eye.  Please use Polytrim drops.  Make sure to wash your hands prior to daily medication and do not touch tip of medication bottle to the eye in order to prevent contamination.  As we discussed, it is possible this is a virus and may not respond to the medication.  You can use over-the-counter medication including lubricating eyedrops/artificial tears.  If your symptoms or not improving quickly please follow-up with ophthalmology; call to schedule an appointment.  If anything worsens and you have visual change, eye pain, fever, nausea, vomiting you need to be seen immediately.

## 2022-07-30 NOTE — ED Triage Notes (Signed)
Pt here for bilateral eye redness and watering starting this am

## 2022-07-30 NOTE — ED Provider Notes (Signed)
[facility]    CSN: [id number] Arrival date & time: 07/30/22  1826      History   Chief Complaint Chief Complaint  Patient presents with   Conjunctivitis    HPI Katie Benjamin is a 42 y.o. female.   Patient presents today with a 24-hour history of bilateral eye irritation that is significantly worse on the right.  Denies any known ocular injury or trauma.  Denies any photophobia, visual disturbance, fever, nausea, vomiting.  She denies any exposure to fine particulate matter or chemicals.  She has not tried any over-the-counter medication for symptom management.  She wears glasses but does not wear contacts.  She denies any additional symptoms including cough, congestion, fever, nausea, vomiting.  She denies any known sick contacts.    Past Medical History:  Diagnosis Date   Anemia 04/2019   Anxiety    Depression    PCOS (polycystic ovarian syndrome)     Patient Active Problem List   Diagnosis Date Noted   Bilateral polycystic ovarian syndrome 02/08/2014    Past Surgical History:  Procedure Laterality Date   CESAREAN SECTION      OB History   No obstetric history on file.      Home Medications    Prior to Admission medications   Medication Sig Start Date End Date Taking? Authorizing Provider  trimethoprim-polymyxin b (POLYTRIM) ophthalmic solution Place 1 drop into both eyes every 6 (six) hours. 07/30/22  Yes Airiel Oblinger, Noberto Retort, PA-C  acetaminophen (TYLENOL) 500 MG tablet Take 500 mg by mouth every 6 (six) hours as needed. 04/27/19 alt w/ibuprofen    [provider]  acetaZOLAMIDE ER (DIAMOX) 500 MG capsule Take 2 capsules (1,000 mg total) by mouth 2 (two) times daily. 06/12/22   Lomax, Amy, NP  acetaZOLAMIDE ER (DIAMOX) 500 MG capsule Take 2 capsules (1,000 mg total) by mouth 2 (two) times daily. 07/11/22   Lomax, Amy, NP  Ascorbic Acid (VITAMIN C) 1000 MG tablet Take 1,000 mg by mouth daily.     [provider]  Cholecalciferol  (VITAMIN D3) 50 MCG (2000 UT) TABS Take 2,000 Units by mouth daily.    [provider]  Multiple Vitamins-Minerals (ONE-A-DAY WOMENS PO) Take 1 tablet by mouth daily with breakfast.    [provider]  ORAL ELECTROLYTES PO Take by mouth.    [provider]    Family History Family History  Problem Relation Age of Onset   Hypertension Mother    Diabetes Mother    Heart attack Father    Hypertension Father    Hyperlipidemia Father    Alcohol abuse Father    Menstrual problems Sister    Alcohol abuse Sister    Drug abuse Sister    Obesity Sister    Obesity Sister    Depression Sister     Social History Social History   Tobacco Use   Smoking status: Never   Smokeless tobacco: Never  Vaping Use   Vaping Use: Never used  Substance Use Topics   Alcohol use: No    Alcohol/week: 0.0 standard drinks of alcohol   Drug use: No     Allergies   Patient has no known allergies.   Review of Systems Review of Systems  Constitutional:  Positive for activity change. Negative for appetite change, fatigue and fever.  Eyes:  Positive for discharge and redness. Negative for photophobia, pain, itching and visual disturbance.  Gastrointestinal:  Negative for abdominal pain, diarrhea, nausea and vomiting.  Neurological:  Negative for dizziness, light-headedness and headaches.     Physical Exam Triage Vital Signs ED Triage Vitals [07/30/22 1849]  Enc Vitals Group     BP (!) 150/81     Pulse Rate 71     Resp 18     Temp 98.1 F (36.7 C)     Temp Source Oral     SpO2 97 %     Weight      Height      Head Circumference      Peak Flow      Pain Score 1     Pain Loc      Pain Edu?      Excl. in GC?    No data found.  Updated Vital Signs BP (!) 150/81 (BP Location: Left Arm)   Pulse 71   Temp 98.1 F (36.7 C) (Oral)   Resp 18   SpO2 97%   Visual Acuity Right Eye Distance: 20/25 Left Eye Distance: 20/20 Bilateral Distance: 20/20  Right Eye  Near:   Left Eye Near:    Bilateral Near:     Physical Exam Vitals reviewed.  Constitutional:      General: She is awake. She is not in acute distress.    Appearance: Normal appearance. She is well-developed. She is not ill-appearing.     Comments: Very pleasant female appears stated age in no acute distress sitting comfortably in exam room  HENT:     Head: Normocephalic and atraumatic.  Eyes:     Extraocular Movements: Extraocular movements intact.     Conjunctiva/sclera:     Right eye: Right conjunctiva is injected. No chemosis.    Left eye: Left conjunctiva is injected. No chemosis.    Pupils: Pupils are equal, round, and reactive to light.     Right eye: No corneal abrasion or fluorescein uptake. Seidel exam negative.  Cardiovascular:     Rate and Rhythm: Normal rate and regular rhythm.     Heart sounds: Normal heart sounds, S1 normal and S2 normal. No murmur heard. Pulmonary:     Effort: Pulmonary effort is normal.     Breath sounds: Normal breath sounds. No wheezing, rhonchi or rales.     Comments: Clear to auscultation bilaterally Psychiatric:        Behavior: Behavior is cooperative.      UC Treatments / Results  Labs (all labs ordered are listed, but only abnormal results are displayed) Labs Reviewed - No data to display  EKG   Radiology No results found.  Procedures Procedures (including critical care time)  Medications Ordered in UC Medications - No data to display  Initial Impression / Assessment and Plan / UC Course  I have reviewed the triage vital signs and the nursing notes.  Pertinent labs & imaging results that were available during my care of the patient were reviewed by me and considered in my medical decision making (see chart for details).     Patient is well-appearing, afebrile, nontoxic, nontachycardic.  Fluorescein staining was obtained and she did have some gritty sensation in her right eye but this did not show any obvious corneal  abnormality or abrasion.  She did have resolution of symptoms with this medication.  Suspect conjunctivitis as etiology of symptoms given clinical presentation.  Will treat with Polytrim drops.  Discussed that it is possible this is a viral illness and her symptoms do not respond to medication.  She can use over-the-counter medications including lubricating eyedrops for  additional symptom relief.  Discussed that if her symptoms or not improving within a few days she should follow-up with our group or with her ophthalmologist.  Discussed that if she has any worsening or changing symptoms including eye pain, visual disturbance, fever, nausea, vomiting she needs to be seen immediately.  Strict return precautions given.  Work excuse note provided.  Final Clinical Impressions(s) / UC Diagnoses   Final diagnoses:  Acute bacterial conjunctivitis of right eye     Discharge Instructions      There is no corneal abrasion on exam.  I am concerned that you have an infection in your eye.  Please use Polytrim drops.  Make sure to wash your hands prior to daily medication and do not touch tip of medication bottle to the eye in order to prevent contamination.  As we discussed, it is possible this is a virus and may not respond to the medication.  You can use over-the-counter medication including lubricating eyedrops/artificial tears.  If your symptoms or not improving quickly please follow-up with ophthalmology; call to schedule an appointment.  If anything worsens and you have visual change, eye pain, fever, nausea, vomiting you need to be seen immediately.     ED Prescriptions     Medication Sig Dispense Auth. Provider   trimethoprim-polymyxin b (POLYTRIM) ophthalmic solution Place 1 drop into both eyes every 6 (six) hours. 10 mL Teniqua Marron K, PA-C      PDMP not reviewed this encounter.   Jeani Hawking, PA-C 07/30/22 1610

## 2023-02-17 ENCOUNTER — Other Ambulatory Visit: Payer: Self-pay

## 2023-02-17 MED ORDER — ACETAZOLAMIDE ER 500 MG PO CP12: ORAL_CAPSULE | ORAL | 0 refills | Status: DC

## 2023-02-20 ENCOUNTER — Other Ambulatory Visit: Payer: Self-pay | Admitting: Family Medicine

## 2023-02-20 ENCOUNTER — Encounter: Payer: Self-pay | Admitting: Family Medicine

## 2023-03-11 ENCOUNTER — Other Ambulatory Visit: Payer: Self-pay

## 2023-03-27 ENCOUNTER — Other Ambulatory Visit: Payer: Self-pay | Admitting: Family Medicine

## 2023-03-27 MED ORDER — ACETAZOLAMIDE ER 500 MG PO CP12: ORAL_CAPSULE | ORAL | 0 refills | Status: DC

## 2023-04-25 ENCOUNTER — Other Ambulatory Visit: Payer: Self-pay

## 2023-04-25 MED ORDER — ACETAZOLAMIDE ER 500 MG PO CP12: ORAL_CAPSULE | ORAL | 0 refills | Status: AC

## 2023-06-03 ENCOUNTER — Other Ambulatory Visit: Payer: Self-pay

## 2023-06-03 MED ORDER — ACETAZOLAMIDE ER 500 MG PO CP12: 1000.0000 mg | ORAL_CAPSULE | Freq: Two times a day (BID) | ORAL | 3 refills | Status: AC

## 2023-06-10 NOTE — Progress Notes (Deleted)
 PATIENT: Katie Benjamin DOB: 06-Jul-1980  REASON FOR VISIT: follow up HISTORY FROM: patient  No chief complaint on file.    HISTORY OF PRESENT ILLNESS:  06/10/23 ALL:  [name] returns for follow up for IIH. She was last seen 06/2022 and doing well on acetazolamide 1000mg  BID.   Eye exam 10/2022  06/12/2022 ALL:  [name] returns for follow up for IIH. She was last seen 12/17/2021 and felt to be doing well on acetazolamide 500mg  BID but after eye exam 12/17/2021, she was advised that papilledema had returned and we increased acetazolamide to 1000mg  BID. Since, she reports doing well. Rare headaches. No vision changes. She had started Optivia prior to out last visit and feels ingredients in diet plan may have contributed to her symptoms. She is now working with [employer] and doing well. She has lost 13lbs in the past 3 weeks. BP has been 120's/70-80's at home. She is feeling well and without concerns. Next eye exam scheduled for 10/2022.   12/11/2021 ALL: [name] returns for follow up for IIH. She continues Diamox 500mg  BID. She reports doing really well since last visit 03/2021. She had lost about 20lbs. Over the past few weeks, she has noticed that she was having more headaches. She has also noted more "whooshing sounds" and pressure behind eyes. She reports walking up a set of stairs recently and reports feeling dizzy. She denies TVOs or vision changes. She is taking ibuprofen most days. She is drinking about 80 ounces of water daily. She does mention a change in diet about 1-2 weeks before headaches worsened. She does not routinely check BP at home.   03/14/2021 ALL: [name] returns for follow up. We weaned Diamox at last visit 10/2020. Since, she has returned to 500mg  BID. She reports worsening headaches within a week of weaning. She is doing fairly well at this time. No headaches. She does have more fullness of her left ear which was presenting symptom. No vision change. She  had normal vision exam over the summer. She is unsure if ear fullness is related to IIH versus allergies. She has gained some weight since last being seen.   10/25/2020 ALL:  [name] returns for follow up for IIH. She is doing very well. She has continues Diamox 500mg  BID. Eye exams q82mo have been normal, no edema. She has started eating a Keto syle diet and has lost about 60 pounds. She would like to wean Diamox.   10/25/2019 ALL:  [name] is a 43 y.o. female here today for follow up for IIH. She is doing well. She is tolerating Diamox 500mg  twice daily. She is working on healthy lifestyle changes and has lost about 30 pounds. She rpeorts headaches have resolved. She has had follow up with ophthalmology who reports edema is improved. No new vision changes.   HISTORY: (copied from Dr Richrd Humbles note on 04/27/2019)  43 year old female here for evaluation of idiopathic intracranial hypertension (pseudotumor cerebri).   Since 2005 patient has had intermittent blurred vision, headaches, heartbeat sensation in ear, transient visual obscurations.  Symptoms only last a few minutes at a time and go away.  Sometimes her headaches would increase when she would have weight gain and improve wench her weight would reduce.  Patient does have diagnosis of polycystic ovarian syndrome.   In last few months patient has had increasing left frontal and retro-orbital headaches and pain.  Also increasing blurred vision and tinnitus sensation.  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  Patient referred to ER for evaluation.  Patient had MRI of the brain and orbits which were unremarkable and then LP which showed elevated opening pressure 41 cm water.  Patient started on acetazolamide 250 mg twice a day and doing well.  Some numbness and tingling in hands and feet.  Headache and blurred vision have improved.   REVIEW OF SYSTEMS: Out of a complete 14 system review of  symptoms, the patient complains only of the following symptoms, headaches, fullness of left ear and all other reviewed systems are negative.   ALLERGIES: No Known Allergies  HOME MEDICATIONS: Outpatient Medications Prior to Visit  Medication Sig Dispense Refill   acetaminophen (TYLENOL) 500 MG tablet Take 500 mg by mouth every 6 (six) hours as needed. 04/27/19 alt w/ibuprofen     acetaZOLAMIDE ER (DIAMOX) 500 MG capsule Take 2 capsules (1,000 mg total) by mouth 2 (two) times daily. 120 capsule 0   acetaZOLAMIDE ER (DIAMOX) 500 MG capsule Take 2 capsules (1,000 mg total) by mouth 2 (two) times daily. 360 capsule 3   Ascorbic Acid (VITAMIN C) 1000 MG tablet Take 1,000 mg by mouth daily.      Cholecalciferol (VITAMIN D3) 50 MCG (2000 UT) TABS Take 2,000 Units by mouth daily.     Multiple Vitamins-Minerals (ONE-A-DAY WOMENS PO) Take 1 tablet by mouth daily with breakfast.     ORAL ELECTROLYTES PO Take by mouth.     trimethoprim-polymyxin b (POLYTRIM) ophthalmic solution Place 1 drop into both eyes every 6 (six) hours. 10 mL 0   No facility-administered medications prior to visit.    PAST MEDICAL HISTORY: Past Medical History:  Diagnosis Date   Anemia 04/2019   Anxiety    Depression    PCOS (polycystic ovarian syndrome)     PAST SURGICAL HISTORY: Past Surgical History:  Procedure Laterality Date   CESAREAN SECTION      FAMILY HISTORY: Family History  Problem Relation Age of Onset   Hypertension Mother    Diabetes Mother    Heart attack Father    Hypertension Father    Hyperlipidemia Father    Alcohol abuse Father    Menstrual problems Sister    Alcohol abuse Sister    Drug abuse Sister    Obesity Sister    Obesity Sister    Depression Sister     SOCIAL HISTORY: Social History   Socioeconomic History   Marital status: Married    Spouse name: Not on file   Number of children: 1   Years of education: 14   Highest education level: Associate degree: academic program   Occupational History    Comment: Print production planner  Tobacco Use   Smoking status: Never   Smokeless tobacco: Never  Vaping Use   Vaping status: Never Used  Substance and Sexual Activity   Alcohol use: No    Alcohol/week: 0.0 standard drinks of alcohol   Drug use: No   Sexual activity: Yes    Birth control/protection: None  Other Topics Concern   Not on file  Social History Narrative   Lives with family   Caffeine- coffee 10ooz per day   No tea/soda   Right handed    Social Drivers of Health   Financial Resource Strain: Low Risk  (05/23/2023)   Received from [facility]   Overall Financial Resource Strain (CARDIA)    Difficulty of Paying Living Expenses: Not hard at all  Food Insecurity: No Food Insecurity (05/23/2023)  Received from [facility] Vital Sign    Worried About Running Out of Food in the Last Year: Never true    Ran Out of Food in the Last Year: Never true  Transportation Needs: No Transportation Needs (05/23/2023)   Received [facility] - Transportation    Lack of Transportation (Medical): No    Lack of Transportation (Non-Medical): No  Physical Activity: Insufficiently Active (05/23/2023)   Received from [facility]   Exercise Vital Sign    Days of Exercise per Week: 1 day    Minutes of Exercise per Session: 30 min  Stress: Stress Concern Present (05/23/2023)   Received from [facility] - Occupational Stress Questionnaire    Feeling of Stress : To some extent  Social Connections: Moderately Integrated (05/23/2023)   Received from [facility]   Social Network    How would you rate your social network (family, work, friends)?: Adequate participation with social networks  Intimate Partner Violence: Not At Risk (05/23/2023)   Received from [facility]   HITS    Over the last 12 months how often did your partner physically hurt you?: Never    Over the last 12 months how often did  your partner insult you or talk down to you?: Never    Over the last 12 months how often did your partner threaten you with physical harm?: Never    Over the last 12 months how often did your partner scream or curse at you?: Never      PHYSICAL EXAM  There were no vitals filed for this visit.     There is no height or weight on file to calculate BMI.  Generalized: Well developed, in no acute distress  Cardiology: normal rate and rhythm, no murmur noted Respiratory: clear to auscultation bilaterally  Neurological examination  Mentation: Alert oriented to time, place, history taking. Follows all commands speech and language fluent Cranial nerve II-XII: Pupils were equal round reactive to light. Extraocular movements were full, visual field were full  Motor: The motor testing reveals 5 over 5 strength of all 4 extremities. Good symmetric motor tone is noted throughout.  Gait and station: Gait is normal.   DIAGNOSTIC DATA (LABS, IMAGING, TESTING) - I reviewed patient records, labs, notes, testing and imaging myself where available.      No data to display           Lab Results  Component Value Date   WBC 8.3 04/21/2019   HGB 8.5 (L) 04/21/2019   HCT 30.0 (L) 04/21/2019   MCV 77.5 (L) 04/21/2019   PLT 459 (H) 04/21/2019      Component Value Date/Time   NA 142 04/21/2019 1759   K 3.9 04/21/2019 1759   CL 109 04/21/2019 1759   CO2 24 04/21/2019 1759   GLUCOSE 100 (H) 04/21/2019 1759   BUN 12 04/21/2019 1759   CREATININE 0.71 04/21/2019 1759   CALCIUM 8.8 (L) 04/21/2019 1759   PROT 6.9 06/30/2011 1233   ALBUMIN 3.7 06/30/2011 1233   AST 20 06/30/2011 1233   ALT 31 06/30/2011 1233   ALKPHOS 91 06/30/2011 1233   BILITOT 0.3 06/30/2011 1233   GFRNONAA >60 04/21/2019 1759   GFRAA >60 04/21/2019 1759   No results found for: "CHOL", "HDL", "LDLCALC", "LDLDIRECT", "TRIG", "CHOLHDL" No results found for: "HGBA1C" No results found for: "VITAMINB12" No results found  for: "TSH"     ASSESSMENT  AND PLAN 43 y.o. year old female  has a past medical history of Anemia (04/2019), Anxiety, Depression, and PCOS (polycystic ovarian syndrome). here with   No diagnosis found.   Naz reports doing well. We will continue acetazolamide 1000mg  BID for now. She was encouraged to continue healthy lifestyle habits. She was commended on weight loss efforts. Follow up with ophthalmology in 10/2022. She will return to see me in 1 year, sooner if needed. She verbalizes understanding and agreement with this plan.    No orders of the defined types were placed in this encounter.     No orders of the defined types were placed in this encounter.      Shawnie Dapper, FNP-C 06/10/2023, 1:07 PM [facility] [address], [address] [place], [place] [address] [phone]

## 2023-06-12 ENCOUNTER — Ambulatory Visit: Admitting: Family Medicine

## 2023-06-12 DIAGNOSIS — G932 Benign intracranial hypertension: Secondary | ICD-10-CM
